# Patient Record
Sex: Male | Born: 1950 | Race: White | Hispanic: No | Marital: Single | Smoking: Never smoker
Health system: Southern US, Community
[De-identification: ages and names within clinical notes are randomized; demographics above are authoritative.]

## PROBLEM LIST (undated history)

## (undated) DIAGNOSIS — I10 Essential (primary) hypertension: Secondary | ICD-10-CM

## (undated) DIAGNOSIS — G4730 Sleep apnea, unspecified: Secondary | ICD-10-CM

## (undated) DIAGNOSIS — Z860100 Personal history of colon polyps, unspecified: Secondary | ICD-10-CM

## (undated) HISTORY — PX: HERNIA REPAIR: SHX51

## (undated) HISTORY — PX: LASIK: SHX215

## (undated) HISTORY — DX: Sleep apnea, unspecified: G47.30

## (undated) HISTORY — DX: Personal history of colonic polyps: Z86.010

## (undated) HISTORY — DX: Personal history of colon polyps, unspecified: Z86.0100

## (undated) HISTORY — DX: Essential (primary) hypertension: I10

---

## 2004-06-06 ENCOUNTER — Encounter: Admission: RE | Admit: 2004-06-06 | Discharge: 2004-06-06 | Payer: Self-pay | Admitting: Family Medicine

## 2004-12-13 ENCOUNTER — Encounter: Admission: RE | Admit: 2004-12-13 | Discharge: 2004-12-13 | Payer: Self-pay | Admitting: Family Medicine

## 2006-02-06 ENCOUNTER — Ambulatory Visit: Payer: Self-pay | Admitting: Family Medicine

## 2006-02-19 ENCOUNTER — Ambulatory Visit: Payer: Self-pay | Admitting: Family Medicine

## 2006-02-26 ENCOUNTER — Ambulatory Visit: Payer: Self-pay | Admitting: Family Medicine

## 2006-03-27 ENCOUNTER — Ambulatory Visit: Payer: Self-pay | Admitting: Family Medicine

## 2006-07-26 ENCOUNTER — Ambulatory Visit: Payer: Self-pay | Admitting: Family Medicine

## 2006-07-30 ENCOUNTER — Ambulatory Visit: Payer: Self-pay | Admitting: Family Medicine

## 2006-08-08 ENCOUNTER — Ambulatory Visit: Payer: Self-pay | Admitting: Family Medicine

## 2006-08-14 HISTORY — PX: COLONOSCOPY: SHX174

## 2006-08-14 LAB — HM COLONOSCOPY: HM Colonoscopy: NORMAL

## 2006-11-26 ENCOUNTER — Ambulatory Visit: Payer: Self-pay | Admitting: Family Medicine

## 2007-01-01 ENCOUNTER — Ambulatory Visit: Payer: Self-pay | Admitting: Family Medicine

## 2007-02-05 ENCOUNTER — Ambulatory Visit: Payer: Self-pay | Admitting: Family Medicine

## 2007-03-12 ENCOUNTER — Ambulatory Visit: Payer: Self-pay | Admitting: Family Medicine

## 2007-04-29 ENCOUNTER — Ambulatory Visit: Payer: Self-pay | Admitting: Family Medicine

## 2007-06-13 ENCOUNTER — Ambulatory Visit (HOSPITAL_COMMUNITY): Admission: RE | Admit: 2007-06-13 | Discharge: 2007-06-13 | Payer: Self-pay | Admitting: General Surgery

## 2007-11-28 ENCOUNTER — Ambulatory Visit: Payer: Self-pay | Admitting: Family Medicine

## 2008-03-24 ENCOUNTER — Ambulatory Visit: Payer: Self-pay | Admitting: Family Medicine

## 2008-03-24 ENCOUNTER — Observation Stay (HOSPITAL_COMMUNITY): Admission: AD | Admit: 2008-03-24 | Discharge: 2008-03-25 | Payer: Self-pay | Admitting: Otolaryngology

## 2008-06-13 IMAGING — CR DG CHEST 2V
2 series · 2 of 2 positions shown · non-contrast
Comparison: none

CLINICAL DATA: Preop from umbilical hernia surgery.  
 CHEST - 2 VIEW:

[w chest pa]
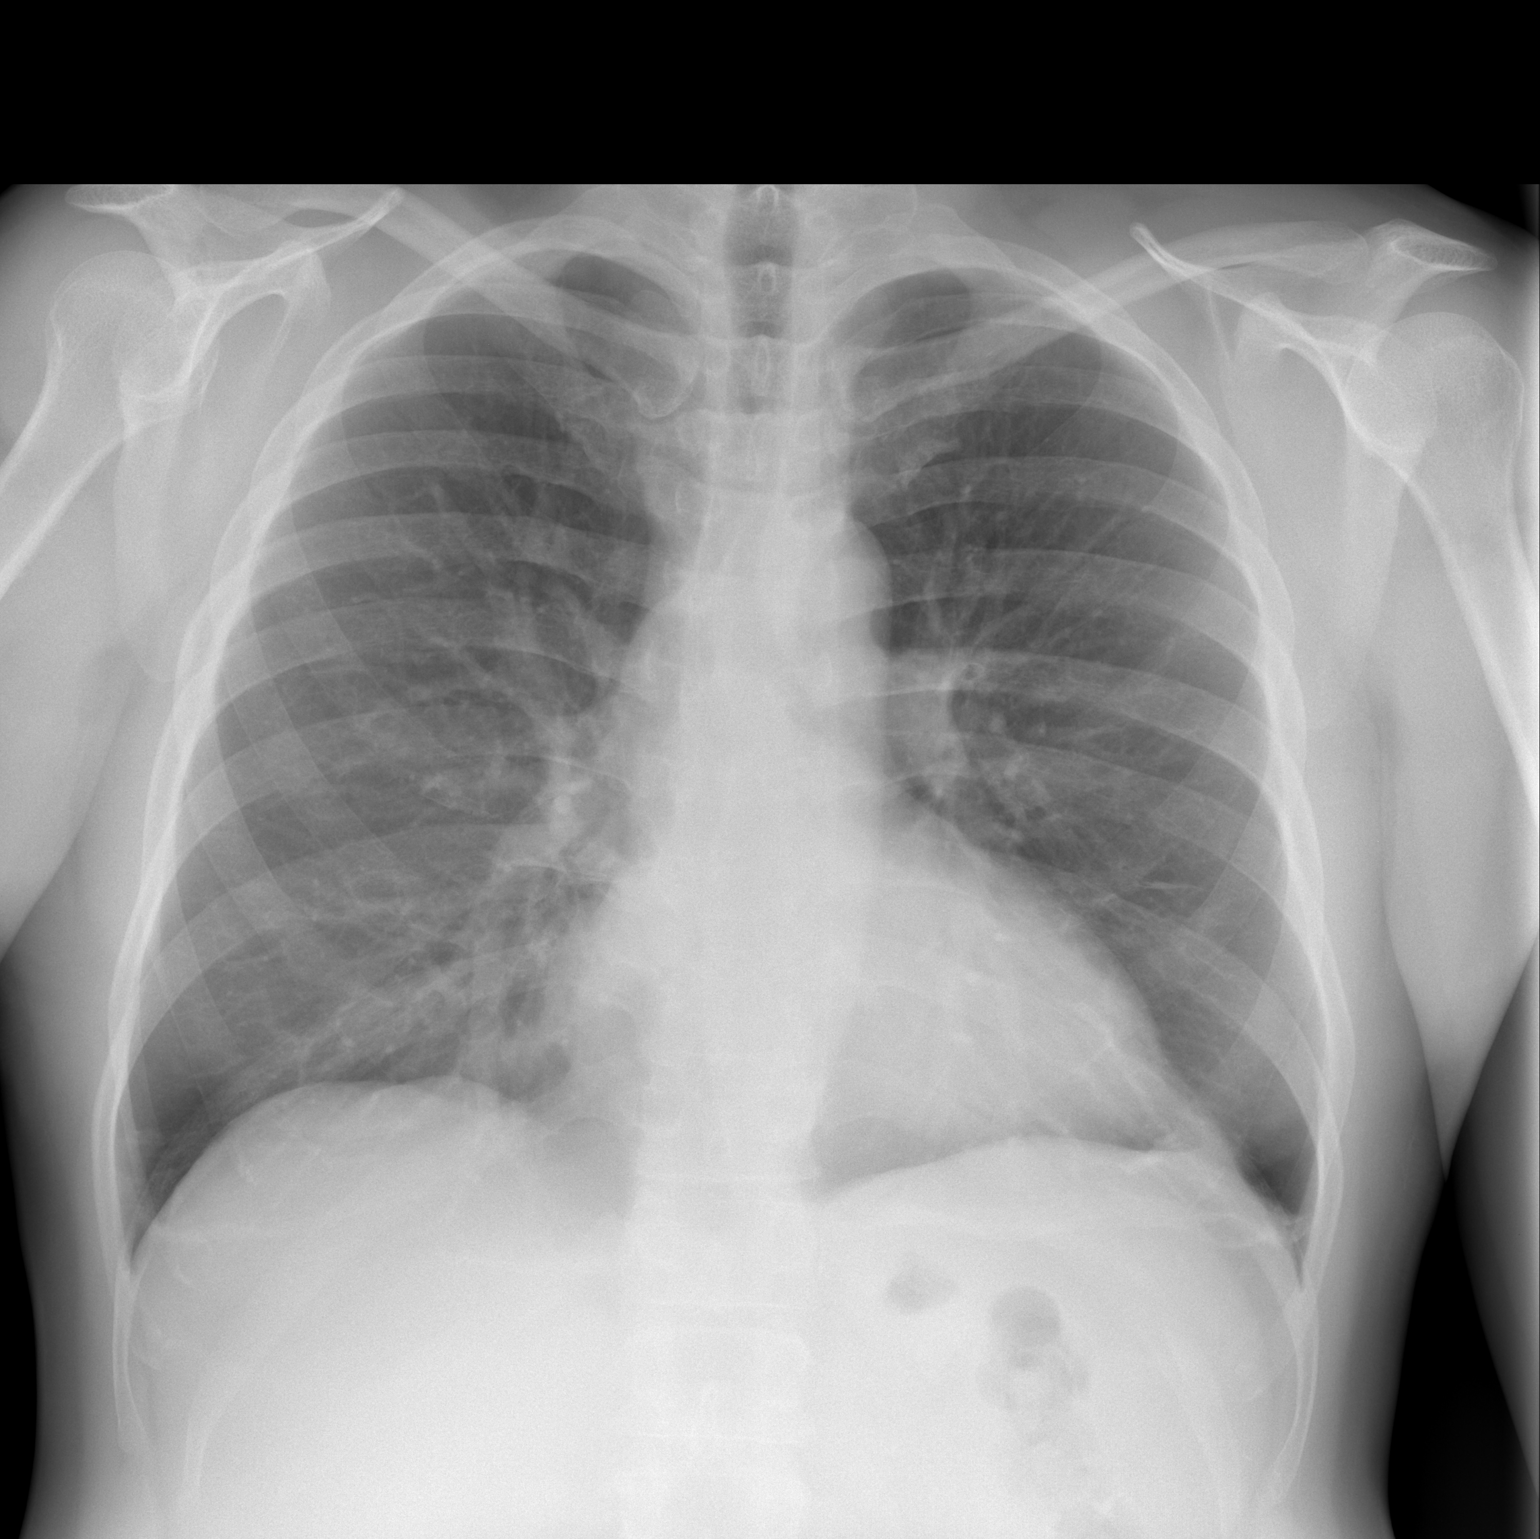

[w chest lat]
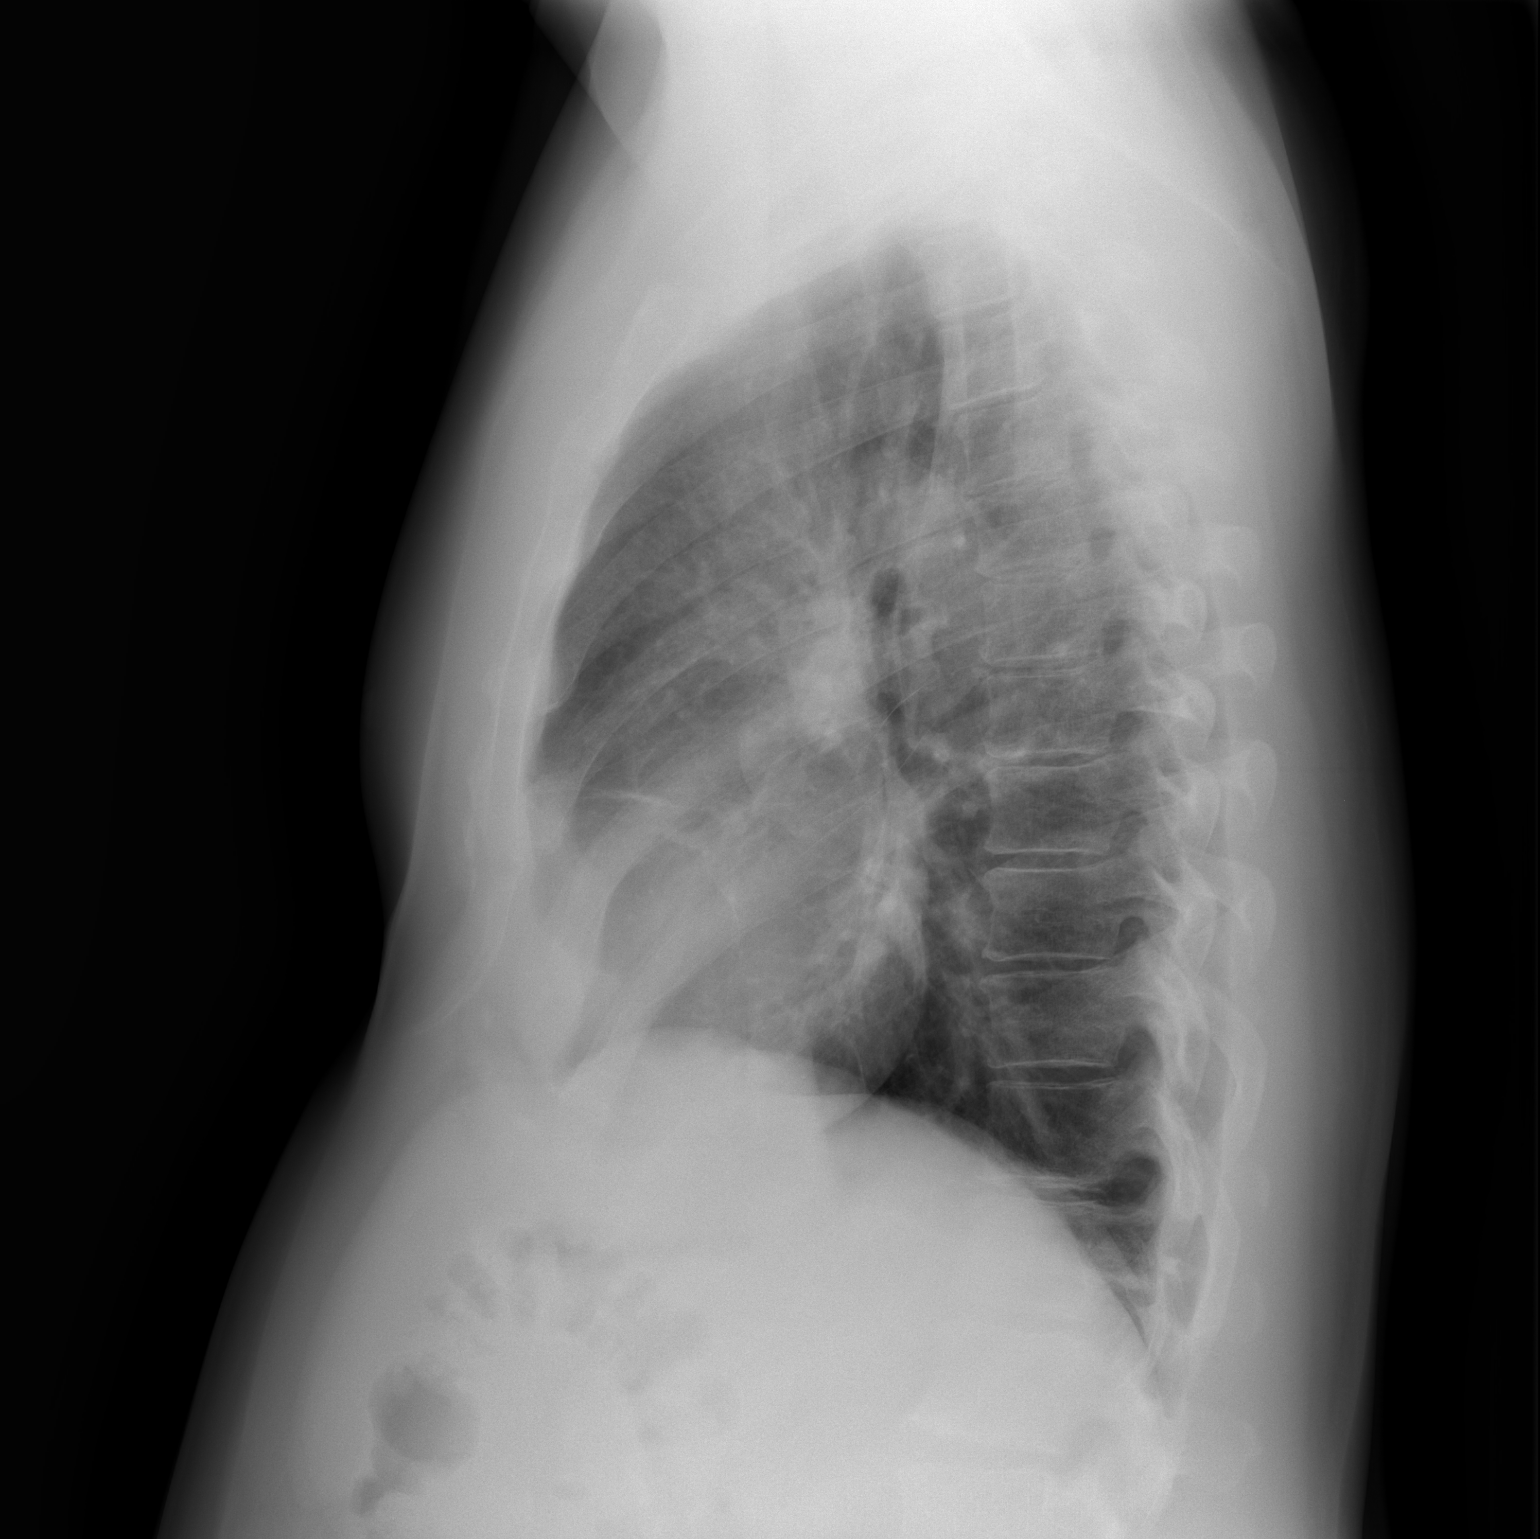

[2 of 2 positions shown; findings below may reference images not displayed]

FINDINGS: Two views of the chest show no active infiltrate or effusion.  The heart is within normal limits in size.  No bony abnormality is seen.  Basilar scarring or atelectasis is noted.
IMPRESSION: Bibasilar scarring or atelectasis.  No active lung disease.

## 2010-06-14 ENCOUNTER — Ambulatory Visit: Payer: Self-pay | Admitting: Family Medicine

## 2010-07-11 ENCOUNTER — Ambulatory Visit: Payer: Self-pay | Admitting: Family Medicine

## 2010-07-22 ENCOUNTER — Ambulatory Visit: Payer: Self-pay | Admitting: Family Medicine

## 2010-10-10 ENCOUNTER — Ambulatory Visit (INDEPENDENT_AMBULATORY_CARE_PROVIDER_SITE_OTHER): Payer: 59 | Admitting: Family Medicine

## 2010-10-10 DIAGNOSIS — L02419 Cutaneous abscess of limb, unspecified: Secondary | ICD-10-CM

## 2010-10-12 ENCOUNTER — Ambulatory Visit (INDEPENDENT_AMBULATORY_CARE_PROVIDER_SITE_OTHER): Payer: 59 | Admitting: Family Medicine

## 2010-10-12 DIAGNOSIS — L0390 Cellulitis, unspecified: Secondary | ICD-10-CM

## 2010-10-12 DIAGNOSIS — I10 Essential (primary) hypertension: Secondary | ICD-10-CM

## 2010-10-12 DIAGNOSIS — Z79899 Other long term (current) drug therapy: Secondary | ICD-10-CM

## 2010-10-13 ENCOUNTER — Ambulatory Visit (INDEPENDENT_AMBULATORY_CARE_PROVIDER_SITE_OTHER): Payer: 59 | Admitting: Family Medicine

## 2010-10-13 DIAGNOSIS — L03119 Cellulitis of unspecified part of limb: Secondary | ICD-10-CM

## 2010-10-14 ENCOUNTER — Ambulatory Visit (INDEPENDENT_AMBULATORY_CARE_PROVIDER_SITE_OTHER): Payer: 59 | Admitting: Family Medicine

## 2010-10-14 DIAGNOSIS — L03119 Cellulitis of unspecified part of limb: Secondary | ICD-10-CM

## 2010-10-17 ENCOUNTER — Ambulatory Visit (INDEPENDENT_AMBULATORY_CARE_PROVIDER_SITE_OTHER): Payer: 59 | Admitting: Family Medicine

## 2010-10-17 DIAGNOSIS — L0390 Cellulitis, unspecified: Secondary | ICD-10-CM

## 2010-10-21 ENCOUNTER — Ambulatory Visit (INDEPENDENT_AMBULATORY_CARE_PROVIDER_SITE_OTHER): Payer: 59 | Admitting: Family Medicine

## 2010-10-21 DIAGNOSIS — L02419 Cutaneous abscess of limb, unspecified: Secondary | ICD-10-CM

## 2010-12-27 NOTE — Op Note (Signed)
NAME:  Jeffrey Forbes, Jeffrey Forbes NO.:  [id number]   MEDICAL RECORD NO.:  [id number]          PATIENT TYPE:  AMB   LOCATION:  DAY                          FACILITY:  [facility]   PHYSICIAN:  Timothy E. Earlene Plater, M.D. DATE OF BIRTH:  12-Jul-1951   DATE OF PROCEDURE:  06/13/2007  DATE OF DISCHARGE:                               OPERATIVE REPORT   PREOPERATIVE DIAGNOSIS:  Umbilical hernia.Marland Kitchen   POSTOPERATIVE DIAGNOSIS:  Umbilical hernia.Marland Kitchen   PROCEDURE:  Repair of hernia with mesh.   SURGEON:  Timothy E. Earlene Plater, M.D.   ANESTHESIA:  Local standby.   Jeffrey Forbes is 49.  He works as a Curator.  He is somewhat overweight  and has developed symptomatic umbilical hernia, wishes to have it  repaired.  This has been carefully discussed.  He is seen, identified  and the permit signed.   He is taken to the operating room, placed supine.  IV sedation given.  The peri umbilical area clipped, prepped and draped.  0.25% Marcaine  with epinephrine was used throughout for local anesthesia.  The hernia  was present on the right side of the umbilical depression. I  anesthetized the entire area, made a curvilinear incision above the  umbilical curve, carried that down to the fascia.  The hernia defect was  only 1 cm but the herniated contents were much larger.  In spite of his  breathing, the contents could be reduced, the defect dissected both  above and below the fascia and a modified Ventralex patch was placed  into the defect and sutured to the anterior fascia.  This was carried  out without difficulty or complications.  The mesh was sewn down with  four sutures of Prolene and four sutures of Vicryl.  The umbilical skin  was positioned and then the wound was closed with Monocryl suture and  Steri-Strips.  Final counts correct.  He tolerated it well, was removed  recovery room in good condition.   Written and verbal instructions including Vicodin #36 were given.  He  will be followed as an  outpatient.      Timothy E. Earlene Plater, M.D.  Electronically Signed     TED/MEDQ  D:  06/13/2007  T:  06/13/2007  Job:  [id number]   cc:   Sharlot Gowda, M.D.  Fax: [phone]

## 2011-03-25 ENCOUNTER — Inpatient Hospital Stay (INDEPENDENT_AMBULATORY_CARE_PROVIDER_SITE_OTHER)
Admission: RE | Admit: 2011-03-25 | Discharge: 2011-03-25 | Disposition: A | Discharge status: Home / Self Care | Payer: 59 | Source: Ambulatory Visit | Attending: Emergency Medicine | Admitting: Emergency Medicine

## 2011-03-25 DIAGNOSIS — T148XXA Other injury of unspecified body region, initial encounter: Secondary | ICD-10-CM

## 2011-05-12 LAB — BASIC METABOLIC PANEL
BUN: 8
CO2: 30
Calcium: 8.6
Chloride: 105
Creatinine, Ser: 0.88
GFR calc Af Amer: >60
GFR calc non Af Amer: >60
Glucose, Bld: 121 — ABNORMAL HIGH
Potassium: 3.1 — ABNORMAL LOW
Sodium: 142

## 2011-05-12 LAB — CBC
HCT: 45.9
Hemoglobin: 15.6
MCHC: 34
MCV: 93.6
Platelets: 157
RBC: 4.9
RDW: 12.6
WBC: 6.1

## 2011-05-12 LAB — VANCOMYCIN, RANDOM: Vancomycin Rm: 10

## 2011-05-24 LAB — COMPREHENSIVE METABOLIC PANEL
ALT: 38
AST: 31
Albumin: 4.1
Alkaline Phosphatase: 58
BUN: 14
CO2: 32
Calcium: 8.8
Chloride: 104
Creatinine, Ser: 1.08
GFR calc Af Amer: >60
GFR calc non Af Amer: >60
Glucose, Bld: 98
Potassium: 3.4 — ABNORMAL LOW
Sodium: 142
Total Bilirubin: 1.1
Total Protein: 6.3

## 2011-05-24 LAB — CBC
HCT: 40.4
Hemoglobin: 14.2
MCHC: 35.2
MCV: 92.6
Platelets: 175
RBC: 4.36
RDW: 13.4
WBC: 5.3

## 2011-05-24 LAB — DIFFERENTIAL
Basophils Absolute: 0
Basophils Relative: 0
Eosinophils Absolute: 0.2
Eosinophils Relative: 4
Lymphocytes Relative: 34
Lymphs Abs: 1.8
Monocytes Absolute: 0.5
Monocytes Relative: 9
Neutro Abs: 2.8
Neutrophils Relative %: 53

## 2011-08-14 ENCOUNTER — Other Ambulatory Visit: Payer: Self-pay | Admitting: Family Medicine

## 2011-08-14 NOTE — Telephone Encounter (Signed)
Pt must have appt for further refills.  °

## 2011-09-12 ENCOUNTER — Encounter: Payer: Self-pay | Admitting: Family Medicine

## 2011-09-12 ENCOUNTER — Ambulatory Visit (INDEPENDENT_AMBULATORY_CARE_PROVIDER_SITE_OTHER): Payer: 59 | Admitting: Family Medicine

## 2011-09-12 VITALS — BP 152/82 | HR 64 | Ht 71.0 in | Wt 194.0 lb

## 2011-09-12 DIAGNOSIS — I10 Essential (primary) hypertension: Secondary | ICD-10-CM | POA: Insufficient documentation

## 2011-09-12 DIAGNOSIS — J019 Acute sinusitis, unspecified: Secondary | ICD-10-CM

## 2011-09-12 MED ORDER — LOSARTAN POTASSIUM-HCTZ 100-12.5 MG PO TABS: 1.0000 | ORAL_TABLET | Freq: Every day | ORAL | Status: DC

## 2011-09-12 MED ORDER — AMOXICILLIN-POT CLAVULANATE 875-125 MG PO TABS: 1.0000 | ORAL_TABLET | Freq: Two times a day (BID) | ORAL | Status: AC

## 2011-09-12 NOTE — Progress Notes (Signed)
  Subjective:    Patient ID: Jeffrey Forbes, male    DOB: 03-Jun-1951, 61 y.o.   MRN: [id number]  HPI He is here for medication recheck. He continues on his blood pressure medication. He is interested in starting an exercise program again. He did have a recent history of a sinusitis and was given Augmentin. He still is having some nasal congestion and occasional blood-tinged mucus. No fever, chills, cough or congestion. He does have a previous history of sinus disease   Review of Systems     Objective:   Physical Exam alert and in no distress. Tympanic membranes and canals are normal. Throat is clear. Tonsils are normal. Neck is supple without adenopathy or thyromegaly. Cardiac exam shows a regular sinus rhythm without murmurs or gallops. Lungs are clear to auscultation.        Assessment & Plan:   1. Sinusitis acute   2. Hypertension    I will place him back on Augmentin. Renew his blood pressure medication and recheck blood pressure in 3 months. Discussed the possibility of placing him on another antihypertensive but will hold off at this time.

## 2011-09-25 ENCOUNTER — Encounter: Payer: Self-pay | Admitting: Family Medicine

## 2011-09-25 ENCOUNTER — Ambulatory Visit (INDEPENDENT_AMBULATORY_CARE_PROVIDER_SITE_OTHER): Payer: 59 | Admitting: Family Medicine

## 2011-09-25 VITALS — BP 124/86 | HR 68 | Wt 197.0 lb

## 2011-09-25 DIAGNOSIS — L0501 Pilonidal cyst with abscess: Secondary | ICD-10-CM

## 2011-09-25 DIAGNOSIS — J309 Allergic rhinitis, unspecified: Secondary | ICD-10-CM | POA: Insufficient documentation

## 2011-09-25 MED ORDER — FLUTICASONE PROPIONATE 50 MCG/ACT NA SUSP: 2.0000 | Freq: Every day | NASAL | Status: DC

## 2011-09-25 NOTE — Progress Notes (Signed)
  Subjective:    Patient ID: Jeffrey Forbes, male    DOB: 1950/11/22, 61 y.o.   MRN: [id number]  HPI Is here for consult concerning 2 issues. He does have underlying allergies and continues to have some nasal congestion and drainage. In the past he had been given Flonase and would like a refill on this. He does have a history of difficulty with sinus infections.  He is also here to discuss intermittent difficulty with pain swelling and discharge from a lesion in the pilonidal area. This has been bothering him off and on for several months. It will swell, cause some discomfort and then drain on its own.   Review of Systems     Objective:   Physical Exam Alert and in no distress. Exam of the gluteal crease does show evidence of recent infection with some erythema but no induration.       Assessment & Plan:   1. Allergic rhinitis, mild   2. Pilonidal abscess    I will give him a Flonase. Also discussed proper care of the pilonidal abscess. He is to return here when the lesion reforms for more definitive care.

## 2011-09-25 NOTE — Patient Instructions (Signed)
The next time this occurs set up an appointment so we can fix this

## 2011-12-05 ENCOUNTER — Encounter: Payer: Self-pay | Admitting: Internal Medicine

## 2011-12-14 ENCOUNTER — Ambulatory Visit (INDEPENDENT_AMBULATORY_CARE_PROVIDER_SITE_OTHER): Payer: 59 | Admitting: Family Medicine

## 2011-12-14 ENCOUNTER — Encounter: Payer: Self-pay | Admitting: Family Medicine

## 2011-12-14 VITALS — BP 138/90 | HR 80 | Wt 191.0 lb

## 2011-12-14 DIAGNOSIS — I10 Essential (primary) hypertension: Secondary | ICD-10-CM

## 2011-12-14 DIAGNOSIS — J309 Allergic rhinitis, unspecified: Secondary | ICD-10-CM

## 2011-12-14 DIAGNOSIS — R040 Epistaxis: Secondary | ICD-10-CM

## 2011-12-14 MED ORDER — MOMETASONE FUROATE 50 MCG/ACT NA SUSP: 2.0000 | Freq: Every day | NASAL | Status: DC

## 2011-12-14 NOTE — Progress Notes (Signed)
  Subjective:    Patient ID: Jeffrey Forbes, male    DOB: 1951-02-13, 61 y.o.   MRN: [id number]  HPI He has had difficulty recently with intermittent nosebleeds but in the last week, the nose bleeds have been daily but a very small amount, usually in the morning. He continues on Flonase but cannot relate this specifically to the cause. He continues on blood pressure medication and is concerned over the reading.   Review of Systems     Objective:   Physical Exam Alert and in no distress. Blood pressure is recorded. Nasal mucosa is slightly red but no evidence of recent bleeding noted. Neck is supple without adenopathy.       Assessment & Plan:  Hypertension. Allergic rhinitis. Epistaxis Continue on present blood pressure medication. I will switch him to Nasonex. He is to let me know if this helps with his epistaxis.

## 2012-03-23 ENCOUNTER — Ambulatory Visit: Payer: 59

## 2012-03-23 ENCOUNTER — Ambulatory Visit (INDEPENDENT_AMBULATORY_CARE_PROVIDER_SITE_OTHER): Payer: 59 | Admitting: Emergency Medicine

## 2012-03-23 VITALS — BP 155/92 | HR 76 | Temp 98.3°F | Resp 16 | Ht 71.0 in | Wt 198.8 lb

## 2012-03-23 DIAGNOSIS — S0180XA Unspecified open wound of other part of head, initial encounter: Secondary | ICD-10-CM

## 2012-03-23 DIAGNOSIS — M25519 Pain in unspecified shoulder: Secondary | ICD-10-CM

## 2012-03-23 DIAGNOSIS — S5000XA Contusion of unspecified elbow, initial encounter: Secondary | ICD-10-CM

## 2012-03-23 DIAGNOSIS — M79603 Pain in arm, unspecified: Secondary | ICD-10-CM

## 2012-03-23 DIAGNOSIS — S0181XA Laceration without foreign body of other part of head, initial encounter: Secondary | ICD-10-CM

## 2012-03-23 MED ORDER — HYDROCODONE-ACETAMINOPHEN 5-500 MG PO TABS: 1.0000 | ORAL_TABLET | ORAL | Status: AC | PRN

## 2012-03-23 NOTE — Progress Notes (Signed)
Patient ID: AQIB LOUGH MRN: [id number], DOB: 1950-11-22, 61 y.o. Date of Encounter: 03/23/2012, 4:41 PM   PROCEDURE NOTE: Verbal consent obtained. Sterile technique employed. Numbing: Anesthesia obtained with 2% lidocaine with epi.   Cleansed with soap and water. Irrigated.  Wound explored, no deep structures involved, no foreign bodies.   Wound repaired with # 5 SI using 5-0 prolene.  Hemostasis obtained. Wound cleansed and dressed.  Wound care instructions including precautions covered with patient. Handout given.  Anticipate suture removal in 5-7 days.   Rhoderick Moody, PA-C 03/23/2012 4:41 PM

## 2012-03-23 NOTE — Progress Notes (Signed)
   Date:  03/23/2012   Name:  Jeffrey Forbes   DOB:  Sep 14, 1950   MRN:  [id number] Gender: male  Age: 61 y.o.  PCP:  Carollee Herter, MD    Chief Complaint: Facial Laceration and LEFT ARM PAIN   History of Present Illness:  Jeffrey Forbes is a 61 y.o. pleasant patient who presents with the following:  Got tangled up letting his dog out on the back porch landing and fell landing on his left elbow injuring his elbow and shoulder.  Struck his chin on the concrete and has a laceration of his chin that he did not notice until he arrived at work.  Denies any LOC, neuro or visual symptoms.  No dental complaints; loose teeth, TMJ pain or fractured teeth.    Patient Active Problem List  Diagnosis  . Hypertension  . Allergic rhinitis, mild    Past Medical History  Diagnosis Date  . Allergic rhinitis   . Sleep apnea   . Hypertension   . Hemorrhoids   . Personal history of colonic polyps     Past Surgical History  Procedure Date  . Colonoscopy 2008    Dr. Elnoria Howard    History  Substance Use Topics  . Smoking status: Never Smoker   . Smokeless tobacco: Never Used  . Alcohol Use: Not on file    No family history on file.  No Known Allergies  Medication list has been reviewed and updated.  Current Outpatient Prescriptions on File Prior to Visit  Medication Sig Dispense Refill  . aspirin 81 MG tablet Take 160 mg by mouth daily.      . fish oil-omega-3 fatty acids 1000 MG capsule Take 2 g by mouth daily.      Marland Kitchen losartan-hydrochlorothiazide (HYZAAR) 100-12.5 MG per tablet Take 1 tablet by mouth daily.  30 tablet  11  . mometasone (NASONEX) 50 MCG/ACT nasal spray Place 2 sprays into the nose daily.  17 g  12  . Multiple Vitamins-Minerals (MULTIVITAMIN WITH MINERALS) tablet Take 1 tablet by mouth daily.        Review of Systems:  As per HPI, otherwise negative.    Physical Examination: Filed Vitals:   03/23/12 1548  BP: 155/92  Pulse: 76  Temp: 98.3 F (36.8 C)    Resp: 16   Filed Vitals:   03/23/12 1548  Height: 5\' 11"  (1.803 m)  Weight: 198 lb 12.8 oz (90.175 kg)   Body mass index is 27.73 kg/(m^2). Ideal Body Weight: Weight in (lb) to have BMI = 25: 178.9    GEN: WDWN, NAD, Non-toxic, Alert & Oriented x 3 HEENT: Atraumatic, Normocephalic.  Ears and Nose: No external deformity. EXTR: No clubbing/cyanosis/edema.  Tender and guards shoulder but full passive ROM.  No ecchymosis or crepitus.  Elbow abrasion with full painless rom.  NATI. NEURO: Normal gait.   Gross motor and cerebellar intact PSYCH: Normally interactive. Conversant. Not depressed or anxious appearing.  Calm demeanor.  Chin:  1.5 cm transverse laceration with no fb.  TMJ, dentition and occlusion are intact.   Assessment and Plan: Laceration chin Shoulder contusion Elbow contusion  Arm sling Follow up in one week Off work til Tuesday vicodin   Carmelina Dane, MD  [facility] reading (PRIMARY) by  Dr. Dareen Piano.  No osseous injury.

## 2012-03-23 NOTE — Patient Instructions (Addendum)
Shoulder Pain The shoulder is a ball and socket joint. The muscles and tendons (rotator cuff) are what keep the shoulder in its joint and stable. This collection of muscles and tendons holds in the head (ball) of the humerus (upper arm bone) in the fossa (cup) of the scapula (shoulder blade). Today no reason was found for your shoulder pain. Often pain in the shoulder may be treated conservatively with temporary immobilization. For example, holding the shoulder in one place using a sling for rest. Physical therapy may be needed if problems continue. HOME CARE INSTRUCTIONS   Apply ice to the sore area for 15 to 20 minutes, 3 to 4 times per day for the first 2 days. Put the ice in a plastic bag. Place a towel between the bag of ice and your skin.   If you have or were given a shoulder sling and straps, do not remove for as long as directed by your caregiver or until you see a caregiver for a follow-up examination. If you need to remove it to shower or bathe, move your arm as little as possible.   Sleep on several pillows at night to lessen swelling and pain.   Only take over-the-counter or prescription medicines for pain, discomfort, or fever as directed by your caregiver.   Keep any follow-up appointments in order to avoid any type of permanent shoulder disability or chronic pain problems.  SEEK MEDICAL CARE IF:   Pain in your shoulder increases or new pain develops in your arm, hand, or fingers.   Your hand or fingers are colder than your other hand.   You do not obtain pain relief with the medications or your pain becomes worse.  SEEK IMMEDIATE MEDICAL CARE IF:   Your arm, hand, or fingers are numb or tingling.   Your arm, hand, or fingers are swollen, painful, or turn white or blue.   You develop chest pain or shortness of breath.  MAKE SURE YOU:   Understand these instructions.   Will watch your condition.   Will get help right away if you are not doing well or get worse.    Document Released: 05/10/2005 Document Revised: 07/20/2011 Document Reviewed: 07/15/2011 [facility] Patient Information 2012 [place], [place].Laceration Care, Adult A laceration is a cut or lesion that goes through all layers of the skin and into the tissue just beneath the skin. TREATMENT  Some lacerations may not require closure. Some lacerations may not be able to be closed due to an increased risk of infection. It is important to see your caregiver as soon as possible after an injury to minimize the risk of infection and maximize the opportunity for successful closure. If closure is appropriate, pain medicines may be given, if needed. The wound will be cleaned to help prevent infection. Your caregiver will use stitches (sutures), staples, wound glue (adhesive), or skin adhesive strips to repair the laceration. These tools bring the skin edges together to allow for faster healing and a better cosmetic outcome. However, all wounds will heal with a scar. Once the wound has healed, scarring can be minimized by covering the wound with sunscreen during the day for 1 full year. HOME CARE INSTRUCTIONS  For sutures or staples:  Keep the wound clean and dry.   If you were given a bandage (dressing), you should change it at least once a day. Also, change the dressing if it becomes wet or dirty, or as directed by your caregiver.   Wash the wound with soap and  water 2 times a day. Rinse the wound off with water to remove all soap. Pat the wound dry with a clean towel.   After cleaning, apply a thin layer of the antibiotic ointment as recommended by your caregiver. This will help prevent infection and keep the dressing from sticking.   You may shower as usual after the first 24 hours. Do not soak the wound in water until the sutures are removed.   Only take over-the-counter or prescription medicines for pain, discomfort, or fever as directed by your caregiver.   Get your sutures or staples removed as  directed by your caregiver.  For skin adhesive strips:  Keep the wound clean and dry.   Do not get the skin adhesive strips wet. You may bathe carefully, using caution to keep the wound dry.   If the wound gets wet, pat it dry with a clean towel.   Skin adhesive strips will fall off on their own. You may trim the strips as the wound heals. Do not remove skin adhesive strips that are still stuck to the wound. They will fall off in time.  For wound adhesive:  You may briefly wet your wound in the shower or bath. Do not soak or scrub the wound. Do not swim. Avoid periods of heavy perspiration until the skin adhesive has fallen off on its own. After showering or bathing, gently pat the wound dry with a clean towel.   Do not apply liquid medicine, cream medicine, or ointment medicine to your wound while the skin adhesive is in place. This may loosen the film before your wound is healed.   If a dressing is placed over the wound, be careful not to apply tape directly over the skin adhesive. This may cause the adhesive to be pulled off before the wound is healed.   Avoid prolonged exposure to sunlight or tanning lamps while the skin adhesive is in place. Exposure to ultraviolet light in the first year will darken the scar.   The skin adhesive will usually remain in place for 5 to 10 days, then naturally fall off the skin. Do not pick at the adhesive film.  You may need a tetanus shot if:  You cannot remember when you had your last tetanus shot.   You have never had a tetanus shot.  If you get a tetanus shot, your arm may swell, get red, and feel warm to the touch. This is common and not a problem. If you need a tetanus shot and you choose not to have one, there is a rare chance of getting tetanus. Sickness from tetanus can be serious. SEEK MEDICAL CARE IF:   You have redness, swelling, or increasing pain in the wound.   You see a red line that goes away from the wound.   You have  yellowish-white fluid (pus) coming from the wound.   You have a fever.   You notice a bad smell coming from the wound or dressing.   Your wound breaks open before or after sutures have been removed.   You notice something coming out of the wound such as wood or glass.   Your wound is on your hand or foot and you cannot move a finger or toe.  SEEK IMMEDIATE MEDICAL CARE IF:   Your pain is not controlled with prescribed medicine.   You have severe swelling around the wound causing pain and numbness or a change in color in your arm, hand, leg, or foot.  Your wound splits open and starts bleeding.   You have worsening numbness, weakness, or loss of function of any joint around or beyond the wound.   You develop painful lumps near the wound or on the skin anywhere on your body.  MAKE SURE YOU:   Understand these instructions.   Will watch your condition.   Will get help right away if you are not doing well or get worse.  Document Released: 07/31/2005 Document Revised: 07/20/2011 Document Reviewed: 01/24/2011 [facility] Patient Information 2012 [place], [place].

## 2012-03-27 ENCOUNTER — Ambulatory Visit (INDEPENDENT_AMBULATORY_CARE_PROVIDER_SITE_OTHER): Payer: 59 | Admitting: Family Medicine

## 2012-03-27 VITALS — BP 169/85 | HR 69 | Temp 98.0°F | Resp 18 | Ht 69.0 in | Wt 198.0 lb

## 2012-03-27 DIAGNOSIS — M25529 Pain in unspecified elbow: Secondary | ICD-10-CM

## 2012-03-27 DIAGNOSIS — M25519 Pain in unspecified shoulder: Secondary | ICD-10-CM

## 2012-03-27 NOTE — Progress Notes (Signed)
[facility]:  Office Visit  Chief Complaint:  Chief Complaint  Patient presents with  . Shoulder Pain    left shoulder follow up    HPI: Jeffrey Forbes is a 61 y.o. male who complains of  Shoulder and elbow pain s/p fall while walking dog.  ROM inproved, pain improved. 70% better.   Past Medical History  Diagnosis Date  . Allergic rhinitis   . Sleep apnea   . Hypertension   . Hemorrhoids   . Personal history of colonic polyps    Past Surgical History  Procedure Date  . Colonoscopy 2008    Dr. Elnoria Howard   History   Social History  . Marital Status: Single    Spouse Name: N/A    Number of Children: N/A  . Years of Education: N/A   Social History Main Topics  . Smoking status: Never Smoker   . Smokeless tobacco: Never Used  . Alcohol Use: Yes     occass  . Drug Use: None  . Sexually Active: None   Other Topics Concern  . None   Social History Narrative  . None   No family history on file. No Known Allergies Prior to Admission medications   Medication Sig Start Date End Date Taking? Authorizing Provider  aspirin 81 MG tablet Take 160 mg by mouth daily.   Yes Historical Provider, MD  fish oil-omega-3 fatty acids 1000 MG capsule Take 2 g by mouth daily.   Yes Historical Provider, MD  losartan-hydrochlorothiazide (HYZAAR) 100-12.5 MG per tablet Take 1 tablet by mouth daily. 09/12/11  Yes Ronnald Nian, MD  mometasone (NASONEX) 50 MCG/ACT nasal spray Place 2 sprays into the nose daily. 12/14/11 12/13/12 Yes Ronnald Nian, MD  Multiple Vitamins-Minerals (MULTIVITAMIN WITH MINERALS) tablet Take 1 tablet by mouth daily.   Yes Historical Provider, MD  HYDROcodone-acetaminophen (VICODIN) 5-500 MG per tablet Take 1 tablet by mouth every 4 (four) hours as needed for pain. 03/23/12 04/02/12  Phillips Odor, MD     ROS: The patient denies fevers, chills, night sweats, unintentional weight loss, chest pain, palpitations, wheezing, dyspnea on exertion, nausea,  vomiting, abdominal pain, dysuria, hematuria, melena, numbness, weakness, or tingling.  All other systems have been reviewed and were otherwise negative with the exception of those mentioned in the HPI and as above.    PHYSICAL EXAM: Filed Vitals:   03/27/12 1815  BP: 169/85  Pulse: 69  Temp: 98 F (36.7 C)  Resp: 18   Filed Vitals:   03/27/12 1815  Height: 5\' 9"  (1.753 m)  Weight: 198 lb (89.812 kg)   Body mass index is 29.24 kg/(m^2).  General: Alert, no acute distress HEENT:  Normocephalic, atraumatic, oropharynx patent.  Cardiovascular:  Regular rate and rhythm, no rubs murmurs or gallops.  No Carotid bruits, radial pulse intact. No pedal edema.  Respiratory: Clear to auscultation bilaterally.  No wheezes, rales, or rhonchi.  No cyanosis, no use of accessory musculature GI: No organomegaly, abdomen is soft and non-tender, positive bowel sounds.  No masses. Skin: No rashes. Neurologic: Facial musculature symmetric. Psychiatric: Patient is appropriate throughout our interaction. Lymphatic: No cervical lymphadenopathy Musculoskeletal: Gait intact. AROM improved per patient, still has slight decrease ROM with AROm and PROM secodnary to pain in IR and ER 5/5 strength Sensation intation Radial pulse intact    LABS: Results for orders placed in visit on 12/14/11  [facility] COLONOSCOPY      Component Value Range   [facility] Colonoscopy  Dr.Hung normal       EKG/XRAY:   Primary read interpreted by Dr. Conley Rolls at [facility].   ASSESSMENT/PLAN: Encounter Diagnoses  Name Primary?  . Shoulder pain Yes  . Elbow pain    Continue with current medications, ROM exercises and use of sling prn F/u if have worsening sxs    Jibril Mcminn PHUONG, DO 03/27/2012 6:29 PM

## 2012-04-01 ENCOUNTER — Ambulatory Visit (INDEPENDENT_AMBULATORY_CARE_PROVIDER_SITE_OTHER): Payer: 59 | Admitting: Internal Medicine

## 2012-04-01 VITALS — BP 152/82 | HR 67 | Temp 98.3°F | Resp 16 | Ht 69.38 in | Wt 196.0 lb

## 2012-04-01 DIAGNOSIS — IMO0002 Reserved for concepts with insufficient information to code with codable children: Secondary | ICD-10-CM

## 2012-04-01 DIAGNOSIS — S43429A Sprain of unspecified rotator cuff capsule, initial encounter: Secondary | ICD-10-CM

## 2012-04-01 DIAGNOSIS — Z4802 Encounter for removal of sutures: Secondary | ICD-10-CM

## 2012-04-01 MED ORDER — MELOXICAM 15 MG PO TABS: 15.0000 mg | ORAL_TABLET | Freq: Every day | ORAL | Status: DC

## 2012-04-01 NOTE — Progress Notes (Signed)
  Subjective:    Patient ID: Jeffrey Forbes, male    DOB: [birth year], 61 y.o.   MRN: [id number]  HPI  Fall 03/23/12 : Got tangled up letting his dog out on the back porch landing and fell landing on his left elbow injuring his elbow and shoulder. Struck his chin on the concrete and has a laceration of his chin; seen here 8/10 and 8/14; xrays normal.   1. Left shoulder pain not keeping patient awake; pain with lateral extension, no pain with arm supination or pronation, pain when extends arm backward, no pain with pressure to top of shoulder, no arm pain  2. Suture removal  Chin  Review of Systems     Objective:   Physical Exam        Assessment & Plan:  PT

## 2012-04-02 NOTE — Progress Notes (Signed)
  Subjective:    Patient ID: Jeffrey Forbes, male    DOB: Apr 10, 1951, 61 y.o.   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 meloxicam (MOBIC) 15 MG tablet    Sig: Take 1 tablet (15 mg total) by mouth daily.    Dispense:  30 tablet    Refill:  1  Sling for shoulder Followup in 2 weeks/note for significant light duty no use of left arm until then

## 2012-04-28 ENCOUNTER — Encounter: Payer: Self-pay | Admitting: Internal Medicine

## 2012-05-13 ENCOUNTER — Telehealth: Payer: Self-pay

## 2012-05-13 NOTE — Telephone Encounter (Signed)
DR.DOOLITTLE: PT WOULD LIKE FMLA PAPERS RE-FILLED OUT BECAUSE [employer] WOULD LIKE TO THE FORM TO INCLUDE MORE INFORMATION TO APPROVE HIS FMLA. FORMS ARE LOCATED IN MAUDIA'S BOX AT THIS TIME. BEST# [phone]

## 2012-05-17 ENCOUNTER — Encounter: Payer: Self-pay | Admitting: Internal Medicine

## 2012-05-20 NOTE — Telephone Encounter (Signed)
Dr Merla Riches asked me to print off OV notes from 03/23/12, 03/27/12 and 04/01/12 to include w/ pt's FMLA papers for the add'l info needed. Printed all note from these OVs and notified pt they are ready for p/up (in drawer at 102).

## 2012-06-01 ENCOUNTER — Other Ambulatory Visit: Payer: Self-pay | Admitting: Internal Medicine

## 2012-06-08 ENCOUNTER — Ambulatory Visit: Payer: 59 | Admitting: Emergency Medicine

## 2012-06-08 VITALS — BP 174/94 | HR 80 | Temp 98.1°F | Resp 16 | Ht 70.0 in | Wt 192.0 lb

## 2012-06-08 DIAGNOSIS — J018 Other acute sinusitis: Secondary | ICD-10-CM

## 2012-06-08 MED ORDER — AMOXICILLIN-POT CLAVULANATE 875-125 MG PO TABS: 1.0000 | ORAL_TABLET | Freq: Two times a day (BID) | ORAL | Status: DC

## 2012-06-08 MED ORDER — PSEUDOEPHEDRINE-GUAIFENESIN ER 60-600 MG PO TB12: 1.0000 | ORAL_TABLET | Freq: Two times a day (BID) | ORAL | Status: DC

## 2012-06-08 NOTE — Progress Notes (Signed)
[facility] [address] [place] [address] [phone]  Date:  06/08/2012   Name:  Jeffrey Forbes   DOB:  May 21, 1951   MRN:  [id number]  PCP:  Carollee Herter, MD    Chief Complaint: Shoulder Injury, Nasal Congestion and Ear Drainage   History of Present Illness:  Jeffrey Forbes is a 61 y.o. very pleasant male patient who presents with the following:  Had rotator cuff tear back in August.  Underwent 8 weeks of PT and reportedly has achieved maximal improvement.  Still has some twinges of pain at times.  Full active ROM and normal strength.  Wednesday developed pain and pressure in right maxillary sinus and right ear.  Has green nasal drainage and post nasal drip.  History of prior sinusitis, largely in fall.  On nasonex with some improvement.  No fever or chills, sore throat, cough.  No wheezing or shortness of breath.  Patient Active Problem List  Diagnosis  . Hypertension  . Allergic rhinitis, mild    Past Medical History  Diagnosis Date  . Allergic rhinitis   . Sleep apnea   . Hypertension   . Hemorrhoids   . Personal history of colonic polyps     Past Surgical History  Procedure Date  . Colonoscopy 2008    Dr. Elnoria Howard  . Hernia repair   . Lasic     History  Substance Use Topics  . Smoking status: Never Smoker   . Smokeless tobacco: Never Used  . Alcohol Use: Yes     occass    Family History  Problem Relation Age of Onset  . Adopted: Yes    No Known Allergies  Medication list has been reviewed and updated.  Current Outpatient Prescriptions on File Prior to Visit  Medication Sig Dispense Refill  . aspirin 81 MG tablet Take 160 mg by mouth daily.      . fish oil-omega-3 fatty acids 1000 MG capsule Take 2 g by mouth daily.      Marland Kitchen losartan-hydrochlorothiazide (HYZAAR) 100-12.5 MG per tablet Take 1 tablet by mouth daily.  30 tablet  11  . mometasone (NASONEX) 50 MCG/ACT nasal spray Place 2 sprays into the nose daily.  17 g   12  . Multiple Vitamins-Minerals (MULTIVITAMIN WITH MINERALS) tablet Take 1 tablet by mouth daily.      . meloxicam (MOBIC) 15 MG tablet TAKE 1 TABLET (15 MG TOTAL) BY MOUTH DAILY.  30 tablet  0    Review of Systems:  As per HPI, otherwise negative.    Physical Examination: Filed Vitals:   06/08/12 1742  BP: 174/94  Pulse: 80  Temp: 98.1 F (36.7 C)  Resp: 16   Filed Vitals:   06/08/12 1742  Height: 5\' 10"  (1.778 m)  Weight: 192 lb (87.091 kg)   Body mass index is 27.55 kg/(m^2). Ideal Body Weight: Weight in (lb) to have BMI = 25: 173.9   GEN: WDWN, NAD, Non-toxic, A & O x 3  No rash or sepsis HEENT: Atraumatic, Normocephalic. Neck supple. No masses, No LAD.  Oropharynx clear Ears and Nose: No external deformity.  TM clear bilaterally.  Right maxillary sinus tender.  Moderate purulent drainage CV: RRR, No M/G/R. No JVD. No thrill. No extra heart sounds. PULM: CTA B, no wheezes, crackles, rhonchi. No retractions. No resp. distress. No accessory muscle use. ABD: S, NT, ND, +BS. No rebound. No HSM. EXTR: No c/c/e.  Full active ROM painlessly left shoulder  NEURO Normal gait.  PSYCH: Normally interactive. Conversant. Not depressed or anxious appearing.  Calm demeanor.    Assessment and Plan: Sinusitis augmentin mucinex d  Rotator cuff sprain Maximal improvement with PT  Carmelina Dane, MD

## 2012-06-20 NOTE — Progress Notes (Signed)
Reviewed and agree.

## 2012-09-04 ENCOUNTER — Other Ambulatory Visit: Payer: Self-pay | Admitting: Family Medicine

## 2012-09-09 ENCOUNTER — Other Ambulatory Visit: Payer: Self-pay | Admitting: Family Medicine

## 2012-12-15 ENCOUNTER — Other Ambulatory Visit: Payer: Self-pay | Admitting: Family Medicine

## 2013-01-27 ENCOUNTER — Ambulatory Visit (INDEPENDENT_AMBULATORY_CARE_PROVIDER_SITE_OTHER): Payer: 59 | Admitting: Family Medicine

## 2013-01-27 ENCOUNTER — Encounter: Payer: Self-pay | Admitting: Family Medicine

## 2013-01-27 VITALS — HR 74 | Wt 201.0 lb

## 2013-01-27 DIAGNOSIS — I10 Essential (primary) hypertension: Secondary | ICD-10-CM

## 2013-01-27 DIAGNOSIS — J019 Acute sinusitis, unspecified: Secondary | ICD-10-CM

## 2013-01-27 DIAGNOSIS — J309 Allergic rhinitis, unspecified: Secondary | ICD-10-CM

## 2013-01-27 MED ORDER — AMOXICILLIN-POT CLAVULANATE 875-125 MG PO TABS: 1.0000 | ORAL_TABLET | Freq: Two times a day (BID) | ORAL | Status: DC

## 2013-01-27 MED ORDER — AMLODIPINE BESYLATE 5 MG PO TABS: 5.0000 mg | ORAL_TABLET | Freq: Every day | ORAL | Status: DC

## 2013-01-27 MED ORDER — BUDESONIDE 32 MCG/ACT NA SUSP: 2.0000 | Freq: Every day | NASAL | Status: DC

## 2013-01-27 NOTE — Progress Notes (Signed)
  Subjective:    Patient ID: Jeffrey Forbes, male    DOB: November 14, 1950, 62 y.o.   MRN: [id number]  HPI He is here for recheck. He did have recent difficulty with sinus infection. He has a previous history of difficulty with this. He was seen in an urgent care Center and given Amoxil which helped but did not clear. Presently he is still having difficulty with sinus congestion, postnasal drainage with some nasal bleeding, malaise and facial pain.He does have an underlying history of allergies. He has been on Flonase and most recently Nasonex for his allergies both which cause difficulty with nasal bleeding.   Review of Systems     Objective:   Physical Exam alert and in no distress. Tympanic membranes and canals are normal. Throat is clear. Tonsils are normal. Neck is supple without adenopathy or thyromegaly. Cardiac exam shows a regular sinus rhythm without murmurs or gallops. Lungs are clear to auscultation. Nasal mucosa is slightly red with tenderness especially over ethmoid sinuses       Assessment & Plan:  Allergic rhinitis, mild - Plan: budesonide (RHINOCORT AQUA) 32 MCG/ACT nasal spray  Hypertension - Plan: amLODipine (NORVASC) 5 MG tablet  Acute sinusitis - Plan: amoxicillin-clavulanate (AUGMENTIN) 875-125 MG per tablet his blood pressure is not under good control and I will therefore add Norvasc. Also switch to Rhinocort and see if he can tolerate this. Augmentin has worked in the past and I will therefore use this. Return here in one month.

## 2013-02-12 ENCOUNTER — Telehealth: Payer: Self-pay | Admitting: Internal Medicine

## 2013-02-12 DIAGNOSIS — J309 Allergic rhinitis, unspecified: Secondary | ICD-10-CM

## 2013-02-12 DIAGNOSIS — I10 Essential (primary) hypertension: Secondary | ICD-10-CM

## 2013-02-12 MED ORDER — BUDESONIDE 32 MCG/ACT NA SUSP: 2.0000 | Freq: Every day | NASAL | Status: DC

## 2013-02-12 MED ORDER — AMLODIPINE BESYLATE 5 MG PO TABS: 5.0000 mg | ORAL_TABLET | Freq: Every day | ORAL | Status: DC

## 2013-02-12 MED ORDER — LOSARTAN POTASSIUM-HCTZ 100-12.5 MG PO TABS: ORAL_TABLET | ORAL | Status: DC

## 2013-02-12 NOTE — Telephone Encounter (Signed)
SENT MED REFILL FOR 1 YEAR

## 2013-02-12 NOTE — Telephone Encounter (Signed)
Pt needs a refill on losartan-hctz 100-12.5mg  to [facility] in [place]

## 2013-02-20 ENCOUNTER — Ambulatory Visit: Payer: 59 | Admitting: Family Medicine

## 2013-02-21 ENCOUNTER — Encounter: Payer: Self-pay | Admitting: Family Medicine

## 2013-02-21 ENCOUNTER — Ambulatory Visit (INDEPENDENT_AMBULATORY_CARE_PROVIDER_SITE_OTHER): Payer: 59 | Admitting: Family Medicine

## 2013-02-21 VITALS — Wt 200.0 lb

## 2013-02-21 DIAGNOSIS — L02219 Cutaneous abscess of trunk, unspecified: Secondary | ICD-10-CM

## 2013-02-21 DIAGNOSIS — L02211 Cutaneous abscess of abdominal wall: Secondary | ICD-10-CM

## 2013-02-21 NOTE — Progress Notes (Signed)
  Subjective:    Patient ID: Jeffrey Forbes, male    DOB: 1951-07-04, 62 y.o.   MRN: [id number]  HPI He complains of intermittent swelling and drainage from a lower mid abdominal lesion. He stated they did get purulent drainage from it. Usually it will manifest itself in one or 2 days and then quiet down. He has been on antibiotics during the same timeframe which did not totally cleared up.  Review of Systems     Objective:   Physical Exam He has a linear 1 cm long lesion with no induration or erythema. This is several centimeters below the umbilicus       Assessment & Plan:  Abdominal wall abscess  recommend he return here when the lesion swells up again for more definitive care. I think the lesion will need to be surgically excised as opposed to I and D.

## 2013-02-21 NOTE — Patient Instructions (Addendum)
When it swells up again set up an appointment for more definitive care

## 2013-02-27 ENCOUNTER — Ambulatory Visit (INDEPENDENT_AMBULATORY_CARE_PROVIDER_SITE_OTHER): Payer: 59 | Admitting: Family Medicine

## 2013-02-27 ENCOUNTER — Encounter: Payer: Self-pay | Admitting: Family Medicine

## 2013-02-27 VITALS — BP 138/76 | HR 90 | Wt 201.0 lb

## 2013-02-27 DIAGNOSIS — I10 Essential (primary) hypertension: Secondary | ICD-10-CM

## 2013-02-27 DIAGNOSIS — J309 Allergic rhinitis, unspecified: Secondary | ICD-10-CM

## 2013-02-27 NOTE — Progress Notes (Signed)
  Subjective:    Patient ID: Jeffrey Forbes, male    DOB: [birth year], 62 y.o.   MRN: [id number]  HPI He is here for recheck. He states that his sinus infection is gone. The new nasal spray is also working. He is not having any nosebleeds.  Review of Systems     Objective:   Physical Exam Alert and in no distress. Blood pressure is recorded       Assessment & Plan:  Hypertension  Allergic rhinitis, mild  continue present medication regimen

## 2013-03-13 ENCOUNTER — Encounter: Payer: Self-pay | Admitting: Internal Medicine

## 2013-03-13 LAB — HM COLONOSCOPY

## 2013-03-25 ENCOUNTER — Encounter: Payer: Self-pay | Admitting: Family Medicine

## 2013-05-29 ENCOUNTER — Ambulatory Visit (INDEPENDENT_AMBULATORY_CARE_PROVIDER_SITE_OTHER): Payer: 59 | Admitting: Family Medicine

## 2013-05-29 VITALS — BP 150/90 | HR 72 | Temp 98.6°F

## 2013-05-29 DIAGNOSIS — Z0000 Encounter for general adult medical examination without abnormal findings: Secondary | ICD-10-CM

## 2013-05-29 DIAGNOSIS — J309 Allergic rhinitis, unspecified: Secondary | ICD-10-CM

## 2013-05-29 DIAGNOSIS — K635 Polyp of colon: Secondary | ICD-10-CM | POA: Insufficient documentation

## 2013-05-29 DIAGNOSIS — G4733 Obstructive sleep apnea (adult) (pediatric): Secondary | ICD-10-CM | POA: Insufficient documentation

## 2013-05-29 DIAGNOSIS — I10 Essential (primary) hypertension: Secondary | ICD-10-CM

## 2013-05-29 DIAGNOSIS — D126 Benign neoplasm of colon, unspecified: Secondary | ICD-10-CM

## 2013-05-29 DIAGNOSIS — G4730 Sleep apnea, unspecified: Secondary | ICD-10-CM

## 2013-05-29 LAB — COMPREHENSIVE METABOLIC PANEL WITH GFR
ALT: 31 U/L (ref 0–53)
AST: 22 U/L (ref 0–37)
Albumin: 4.7 g/dL (ref 3.5–5.2)
Alkaline Phosphatase: 85 U/L (ref 39–117)
BUN: 11 mg/dL (ref 6–23)
CO2: 33 meq/L — ABNORMAL HIGH (ref 19–32)
Calcium: 9 mg/dL (ref 8.4–10.5)
Chloride: 99 meq/L (ref 96–112)
Creat: 0.95 mg/dL (ref 0.50–1.35)
Glucose, Bld: 90 mg/dL (ref 70–99)
Potassium: 3.2 meq/L — ABNORMAL LOW (ref 3.5–5.3)
Sodium: 139 meq/L (ref 135–145)
Total Bilirubin: 0.7 mg/dL (ref 0.3–1.2)
Total Protein: 6.7 g/dL (ref 6.0–8.3)

## 2013-05-29 LAB — CBC WITH DIFFERENTIAL/PLATELET
Basophils Absolute: 0 K/uL (ref 0.0–0.1)
Basophils Relative: 1 % (ref 0–1)
Eosinophils Absolute: 0.3 K/uL (ref 0.0–0.7)
Eosinophils Relative: 5 % (ref 0–5)
HCT: 42.6 % (ref 39.0–52.0)
Hemoglobin: 15.3 g/dL (ref 13.0–17.0)
Lymphocytes Relative: 20 % (ref 12–46)
Lymphs Abs: 1.3 K/uL (ref 0.7–4.0)
MCH: 32.5 pg (ref 26.0–34.0)
MCHC: 35.9 g/dL (ref 30.0–36.0)
MCV: 90.4 fL (ref 78.0–100.0)
Monocytes Absolute: 0.9 K/uL (ref 0.1–1.0)
Monocytes Relative: 14 % — ABNORMAL HIGH (ref 3–12)
Neutro Abs: 4 K/uL (ref 1.7–7.7)
Neutrophils Relative %: 60 % (ref 43–77)
Platelets: 185 K/uL (ref 150–400)
RBC: 4.71 MIL/uL (ref 4.22–5.81)
RDW: 13.5 % (ref 11.5–15.5)
WBC: 6.5 K/uL (ref 4.0–10.5)

## 2013-05-29 LAB — POCT URINALYSIS DIPSTICK
Blood, UA: NEGATIVE
Glucose, UA: NEGATIVE
Ketones, UA: NEGATIVE
Protein, UA: NEGATIVE
Spec Grav, UA: 1.01
Urobilinogen, UA: NEGATIVE

## 2013-05-29 LAB — LIPID PANEL
Cholesterol: 175 mg/dL (ref 0–200)
HDL: 32 mg/dL — ABNORMAL LOW
LDL Cholesterol: 90 mg/dL (ref 0–99)
Total CHOL/HDL Ratio: 5.5 ratio
Triglycerides: 264 mg/dL — ABNORMAL HIGH
VLDL: 53 mg/dL — ABNORMAL HIGH (ref 0–40)

## 2013-05-29 MED ORDER — MOMETASONE FUROATE 50 MCG/ACT NA SUSP: 2.0000 | Freq: Every day | NASAL | Status: DC

## 2013-05-29 NOTE — Progress Notes (Signed)
  Subjective:    Patient ID: Jeffrey Forbes, male    DOB: 1950-10-15, 62 y.o.   MRN: [id number]  HPI He is here for complete examination. He does have an underlying history of allergic rhinitis and occasional difficulty with sinusitis. He has tried Flonase in the past but has cause difficulty with nose bleeding. He would like to be placed back on Nasonex which has worked well in the past. He also has sleep apnea but has not had a CPAP readout in quite some time.He continues on his blood pressure medications. He has had a colonoscopy which did show tubular adenoma. Social and family history were reviewed. 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  Review of Systems Negative except as above    Objective:   Physical Exam BP 150/90  Pulse 72  Temp(Src) 98.6 F (37 C) (Oral)  SpO2 97%  General Appearance:    Alert, cooperative, no distress, appears stated age  Head:    Normocephalic, without obvious abnormality, atraumatic  Eyes:    PERRL, conjunctiva/corneas clear, EOM's intact, fundi    benign  Ears:    Normal TM's and external ear canals  Nose:   Nares normal, mucosa normal, no drainage or sinus   tenderness  Throat:   Lips, mucosa, and tongue normal; teeth and gums normal  Neck:   Supple, no lymphadenopathy;  thyroid:  no   enlargement/tenderness/nodules; no carotid   bruit or JVD  Back:    Spine nontender, no curvature, ROM normal, no CVA     tenderness  Lungs:     Clear to auscultation bilaterally without wheezes, rales or     ronchi; respirations unlabored  Chest Wall:    No tenderness or deformity   Heart:    Regular rate and rhythm, S1 and S2 normal, no murmur, rub   or gallop  Breast Exam:    No chest wall tenderness, masses or gynecomastia  Abdomen:     Soft, non-tender, nondistended, normoactive bowel sounds,    no masses, no hepatosplenomegaly        Extremities:   No clubbing, cyanosis or edema  Pulses:   2+ and symmetric all extremities  Skin:   Skin color,  texture, turgor normal, no rashes or lesions  Lymph nodes:   Cervical, supraclavicular, and axillary nodes normal  Neurologic:   CNII-XII intact, normal strength, sensation and gait; reflexes 2+ and symmetric throughout          Psych:   Normal mood, affect, hygiene and grooming.          Assessment & Plan:  Routine general medical examination at a health care facility - Plan: CBC with Differential, Comprehensive metabolic panel, Lipid panel  Hypertension - Plan: POCT Urinalysis Dipstick  Allergic rhinitis, mild - Plan: mometasone (NASONEX) 50 MCG/ACT nasal spray  Colonic polyp  Sleep apnea  I will attempt to get a CPAP readout earache he would place on Nasonex. Continue on other medications.

## 2013-05-30 ENCOUNTER — Encounter: Payer: Self-pay | Admitting: Family Medicine

## 2013-06-02 NOTE — Progress Notes (Signed)
Quick Note:  MAILED PT LETTER OF LABS WITH DIET INFO ______

## 2013-07-15 ENCOUNTER — Telehealth: Payer: Self-pay

## 2013-07-15 NOTE — Telephone Encounter (Signed)
I GOT A MESSAGE FROM BILL THIS MORNING WANTING TO KNOW ABOUT THE CPAP READOUT SAID HE IS MORE TIRED NOW DOESN'T THINK CPAP IS WORKING RIGHT . I HAVE LOOKED IN CHART AND SEE NO RESULTS SO I CALLED MELISSA AT [facility] SHE APOLOGIZED  THEY HAVE NOT SENT TO [place] THE REPORT YET SO THIS IS ALL JUST AN FYI SHE IS FAXING RESULTS NOW

## 2013-07-17 ENCOUNTER — Telehealth: Payer: Self-pay | Admitting: Family Medicine

## 2013-07-17 NOTE — Telephone Encounter (Signed)
I HAVE CALLED PT TO INFORM HIM JCL WANTS TO DO ANOTHER SLEEP STUDY HE AGREED AND WANTED IT DONE IN [place] SO I HAVE FAXED REFERRAL TO SLEEP SERVICES IN [facility] TELL# IS [phone] FAX# IS [phone]

## 2013-07-17 NOTE — Telephone Encounter (Signed)
Work with the DME company to see if we can get a sleep study to determine proper CPAP setting.

## 2013-08-05 ENCOUNTER — Other Ambulatory Visit: Payer: Self-pay | Admitting: Family Medicine

## 2013-10-29 ENCOUNTER — Encounter: Payer: Self-pay | Admitting: Family Medicine

## 2013-10-30 ENCOUNTER — Telehealth: Payer: Self-pay | Admitting: Internal Medicine

## 2013-10-30 NOTE — Telephone Encounter (Signed)
Pt states that he is still not waking up refreshed that he could fall asleep at a stop light, or stop sign and just wants to press the snooze button over and over again. Pt gets about 5-6 hours a sleep and he wants to know what else does he need to do

## 2013-10-31 NOTE — Telephone Encounter (Signed)
Jeffrey Forbes

## 2013-10-31 NOTE — Telephone Encounter (Signed)
Make sure his CPAP set at 8 and if so, set him up to see Lucia Estelle for a consult concerning his sleep apnea

## 2013-11-03 NOTE — Telephone Encounter (Signed)
Pt scheduled to see Dr. Baird Lyons 12/11/13 at 2:45 regarding sleep apnea

## 2013-11-27 ENCOUNTER — Ambulatory Visit (INDEPENDENT_AMBULATORY_CARE_PROVIDER_SITE_OTHER): Payer: 59 | Admitting: Family Medicine

## 2013-11-27 ENCOUNTER — Encounter: Payer: Self-pay | Admitting: Family Medicine

## 2013-11-27 VITALS — BP 140/84 | HR 84 | Temp 98.3°F | Ht 71.0 in | Wt 199.0 lb

## 2013-11-27 DIAGNOSIS — J069 Acute upper respiratory infection, unspecified: Secondary | ICD-10-CM

## 2013-11-27 MED ORDER — AMOXICILLIN 500 MG PO TABS: 1000.0000 mg | ORAL_TABLET | Freq: Two times a day (BID) | ORAL | Status: DC

## 2013-11-27 NOTE — Patient Instructions (Signed)
  Drink plenty of fluids. Sinus rinses once or twice daily as needed Use mucinex as directed  Use tylenol or ibuprofen as needed for pain, fever  Expect symptoms to improve over the next 2-5 days; if symptoms progressively get worse, rather than improve, start the antibiotics.  You must complete the entire course.  Call or return if symptoms do not improve (or if worse)

## 2013-11-27 NOTE — Progress Notes (Signed)
Chief Complaint  Patient presents with  . Cough    started with a sore throat Monday. Has progressed to cough that does produce some discolored mucus. Sinus pain and pressure. No fevers.    3 days ago he woke up with sore throat, then developed head congestion, sinus pain, postnasal drainage.  Now having some deeper/hoarse voice, chest congestion, cough.  He is having pain across forehead, between eyes, and both cheeks. Unsure of color of nasal drainage.  Cough is productive of yellow-greenish mucus, small amounts.  He feels winded after a coughing spell.  He felt winded after walking up steps at home today.  He has been on the zyrtec long-term.  Restarted the nasal steroid spray about 2.5 weeks ago.  He started Mucinex on Monday (plain, 4 hr version, taking 3-4x/day)  Past Medical History  Diagnosis Date  . Allergic rhinitis   . Sleep apnea   . Hypertension   . Hemorrhoids   . Personal history of colonic polyps    Past Surgical History  Procedure Laterality Date  . Colonoscopy  2008    Dr. Benson Norway  . Hernia repair    . Lasik     History   Social History  . Marital Status: Single    Spouse Name: N/A    Number of Children: N/A  . Years of Education: N/A   Occupational History  . 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 Smoking status: Never Smoker   . Smokeless tobacco: Never Used  . Alcohol Use: Yes     Comment: occasional (1-2/month)  . Drug Use: No  . Sexual Activity: Not on file   Other Topics Concern  . Not on file   Social History Narrative  . No narrative on file    Outpatient Encounter Prescriptions as of 11/27/2013  Medication Sig  . amLODipine (NORVASC) 5 MG tablet Take 1 tablet (5 mg total) by mouth daily.  Marland Kitchen aspirin 81 MG tablet Take 160 mg by mouth daily.  . cetirizine (ZYRTEC) 10 MG tablet Take 10 mg by mouth daily.  . fish oil-omega-3 fatty acids 1000 MG capsule Take 2 g by mouth daily.  . GuaiFENesin (MUCINEX PO) Take 1  tablet by mouth every 4 (four) hours as needed.  Marland Kitchen losartan-hydrochlorothiazide (HYZAAR) 100-12.5 MG per tablet TAKE 1 TABLET BY MOUTH DAILY.  . mometasone (NASONEX) 50 MCG/ACT nasal spray Place 2 sprays into the nose daily.  . Multiple Vitamins-Minerals (MULTIVITAMIN WITH MINERALS) tablet Take 1 tablet by mouth daily.   No Known Allergies  ROS: Denies fevers, chills, myalgias, nausea, vomiting, bleeding, bruising or skin rashes.  Denies abdominal pain, joint pains, chest pain, palpitations. +URI symptoms as per HPI  PHYSICAL EXAM: BP 140/84  Pulse 84  Temp(Src) 98.3 F (36.8 C) (Oral)  Ht 5\' 11"  (1.803 m)  Wt 199 lb (90.266 kg)  BMI 27.77 kg/m2 Well developed, pleasant male in no distress.  Rare cough HEENT:  PERRL, EOMI, conjunctiva clear.  TM's and EAC's normal. Nasal mucosa moderately edematous, R>L, no significant erythema, no purulence.  Sinuses nontender.  OP clear Neck: no lymphadenopathy or mass Heart: regular rate and rhythm without murmur Lungs: clear bilaterally.  No wheeze, rales, ronchi, good air movement Extremities: no edema Skin: no rash, bruising  ASSESSMENT/PLAN:  Acute upper respiratory infections of unspecified site - Plan: amoxicillin (AMOXIL) 500 MG tablet  Suspect viral.  Reviewed normal course. Start ABX if symptoms persist/worsen.   Drink plenty  of fluids. Sinus rinses once or twice daily as needed Use mucinex as directed  Use tylenol or ibuprofen as needed for pain, fever  Expect symptoms to improve over the next 2-5 days; if symptoms progressively get worse, rather than improve, start the antibiotics.  You must complete the entire course.  Call or return if symptoms do not improve (or if worse)

## 2013-12-10 ENCOUNTER — Telehealth: Payer: Self-pay | Admitting: Internal Medicine

## 2013-12-10 NOTE — Telephone Encounter (Signed)
Error.  Wrong patient.  Jeffrey Forbes ° °

## 2013-12-11 ENCOUNTER — Encounter: Payer: Self-pay | Admitting: Internal Medicine

## 2013-12-11 ENCOUNTER — Ambulatory Visit (INDEPENDENT_AMBULATORY_CARE_PROVIDER_SITE_OTHER): Payer: 59 | Admitting: Internal Medicine

## 2013-12-11 VITALS — BP 132/88 | HR 75 | Ht 71.0 in | Wt 200.0 lb

## 2013-12-11 DIAGNOSIS — J209 Acute bronchitis, unspecified: Secondary | ICD-10-CM

## 2013-12-11 DIAGNOSIS — G4733 Obstructive sleep apnea (adult) (pediatric): Secondary | ICD-10-CM

## 2013-12-11 NOTE — Patient Instructions (Addendum)
Order- [facility]ME [facility]- Autotitrate CPAP 5-15 cwp x 7 days, also mask of choice, supplies as needed    Dx OSA  Work note for today

## 2013-12-11 NOTE — Progress Notes (Signed)
12/11/13- [name] never smoker seen on kind referral by  Dr. Jill Alexanders for obstructive sleep apnea.  New sleep study 10/2013 and wearing CPAP 8/ Advanced 6 hours per night.  Epworth score 19 Original diagnosis 2005. Sleeps better with CPAP but still waking tired.Has new CPAP machine 8 cwp/ [facility], nasal pillows mask. Bedtime 1:30-2:30, short latency, up 8-9:00AM.  Not on call at night. 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 Weight was 195 lbs. Treated for HBP, allergic rhinitis. Adopted. Unmarried, living alone with 8 dogs, 1 cat. Additional Problem- DOE and productive cough since a chest cold in early spring. Not improving. Always stuffy, but not obvious sinus infection. No hx asthma. Sputum white.   Prior to Admission medications   Medication Sig Start Date End Date Taking? Authorizing Provider  amLODipine (NORVASC) 5 MG tablet Take 1 tablet (5 mg total) by mouth daily. 02/12/13  Yes Denita Lung, MD  aspirin 81 MG tablet Take 160 mg by mouth daily.   Yes Historical Provider, MD  cetirizine (ZYRTEC) 10 MG tablet Take 10 mg by mouth daily.   Yes Historical Provider, MD  fish oil-omega-3 fatty acids 1000 MG capsule Take 2 g by mouth daily.   Yes Historical Provider, MD  GuaiFENesin (MUCINEX PO) Take 1 tablet by mouth every 4 (four) hours as needed.   Yes Historical Provider, MD  losartan-hydrochlorothiazide (HYZAAR) 100-12.5 MG per tablet TAKE 1 TABLET BY MOUTH DAILY. 08/05/13  Yes Denita Lung, MD  mometasone (NASONEX) 50 MCG/ACT nasal spray Place 2 sprays into the nose daily. 05/29/13  Yes Denita Lung, MD  Multiple Vitamins-Minerals (MULTIVITAMIN WITH MINERALS) tablet Take 1 tablet by mouth daily.   Yes Historical Provider, MD   Past Medical History  Diagnosis Date  . Allergic rhinitis   . Sleep apnea   . Hypertension   . Hemorrhoids   . Personal history of colonic polyps    Past Surgical  History  Procedure Laterality Date  . Colonoscopy  2008    Dr. Benson Norway  . Hernia repair    . Lasik     Family History  Problem Relation Age of Onset  . Adopted: Yes   History   Social History  . Marital Status: Single    Spouse Name: N/A    Number of Children: N/A  . Years of Education: N/A   Occupational History  . 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 Smoking status: Never Smoker   . Smokeless tobacco: Never Used  . Alcohol Use: Yes     Comment: occasional (1-2/month)  . Drug Use: No  . Sexual Activity: Not on file   Other Topics Concern  . Not on file   Social History Narrative  . No narrative on file   ROS-see HPI Constitutional:   No-   weight loss, night sweats, fevers, chills, +fatigue, lassitude. HEENT:   No-  headaches, difficulty swallowing, tooth/dental problems, sore throat,       +sneezing, +itching, ear ache, +nasal congestion, post nasal drip,  CV:  No-   chest pain, orthopnea, PND, swelling in lower extremities, anasarca,                                  dizziness, palpitations Resp: +shortness of breath with exertion or at rest.             +  productive cough,  No non-productive cough,  No- coughing up of blood.              No-   change in color of mucus.  No- wheezing.   Skin: No-   rash or lesions. GI:  No-   heartburn, indigestion, abdominal pain, nausea, vomiting, diarrhea,                 change in bowel habits, loss of appetite GU: No-   dysuria, change in color of urine, no urgency or frequency.  No- flank pain. MS:  No-   joint pain or swelling.  No- decreased range of motion.  No- back pain. Neuro-     nothing unusual Psych:  No- change in mood or affect. No depression or anxiety.  No memory loss.  OBJ- Physical Exam General- Alert, Oriented, Affect-appropriate, Distress- none acute, medium build Skin- rash-none, lesions- none, excoriation- none Lymphadenopathy- none Head- atraumatic            Eyes-  Gross vision intact, PERRLA, conjunctivae and secretions clear            Ears- Hearing, canals-normal            Nose- Clear, no-Septal dev, mucus, polyps, erosion, perforation             Throat- Mallampati II , mucosa clear , drainage- none, tonsils- atrophic Neck- flexible , trachea midline, no stridor , thyroid nl, carotid no bruit Chest - symmetrical excursion , unlabored           Heart/CV- RRR , no murmur , no gallop  , no rub, nl s1 s2                           - JVD- none , edema- none, stasis changes- none, varices- none           Lung- clear to P&A, wheeze- none, cough- none , dullness-none, rub- none           Chest wall-  Abd- tender-no, distended-no, bowel sounds-present, HSM- no Br/ Gen/ Rectal- Not done, not indicated Extrem- cyanosis- none, clubbing, none, atrophy- none, strength- nl Neuro- grossly intact to observation

## 2014-01-07 DIAGNOSIS — J209 Acute bronchitis, unspecified: Secondary | ICD-10-CM | POA: Insufficient documentation

## 2014-01-07 NOTE — Assessment & Plan Note (Signed)
Slowly resolving acute bronchitis. Discussed symptomatic management and when to seek more attention.

## 2014-02-12 ENCOUNTER — Ambulatory Visit (INDEPENDENT_AMBULATORY_CARE_PROVIDER_SITE_OTHER): Payer: 59 | Admitting: Internal Medicine

## 2014-02-12 ENCOUNTER — Encounter: Payer: Self-pay | Admitting: Internal Medicine

## 2014-02-12 VITALS — BP 136/70 | HR 76 | Ht 71.0 in | Wt 198.4 lb

## 2014-02-12 DIAGNOSIS — G4733 Obstructive sleep apnea (adult) (pediatric): Secondary | ICD-10-CM

## 2014-02-12 NOTE — Patient Instructions (Signed)
Order- DME [facility]  Download for pressure compliance assay      Dx OSA  Ask Dr Redmond School about memory assessment and suggestions.

## 2014-02-12 NOTE — Progress Notes (Signed)
12/11/13- [name] never smoker seen on kind referral by  Dr. Jill Alexanders for obstructive sleep apnea.  New sleep study 10/2013 and wearing CPAP 8/ Advanced 6 hours per night.  Epworth score 19 Original diagnosis 2005. Sleeps better with CPAP but still waking tired.Has new CPAP machine 8 cwp/ Advanced, nasal pillows mask. Bedtime 1:30-2:30, short latency, up 8-9:00AM.  Not on call at night. 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 Weight was 195 lbs. Treated for HBP, allergic rhinitis. Adopted. Unmarried, living alone with 8 dogs, 1 cat. Additional Problem- DOE and productive cough since a chest cold in early spring. Not improving. Always stuffy, but not obvious sinus infection. No hx asthma. Sputum white.   02/12/14- [name] never smoker seen on kind referral by  Dr. Jill Alexanders for obstructive sleep apnea.  New sleep study 10/2013 and wearing CPAP 8/ Advanced 6 hours per night.  Epworth score 19 FOLLOWS FOR: Pt states the change in pressure has seemed to help, although pt stated he had "wet his pants" one night. Pt c/o the cpap no longer helping and feels like he did before the pressure change. Pt is woried about his memory, pt "becoming absent minded ". He tells me sleep is comfortable with nasal pillows mask. He is more concerned that he might be getting some dementia.  ROS-see HPI Constitutional:   No-   weight loss, night sweats, fevers, chills, +fatigue, lassitude. HEENT:   No-  headaches, difficulty swallowing, tooth/dental problems, sore throat,       No-sneezing, no-itching, ear ache, +nasal congestion, post nasal drip,  CV:  No-   chest pain, orthopnea, PND, swelling in lower extremities, anasarca,                                  dizziness, palpitations Resp: +shortness of breath with exertion or at rest.             + productive cough,  No non-productive cough,  No- coughing up of blood.    No-   change in color of mucus.  No- wheezing.   Skin: No-   rash or lesions. GI:  No-   heartburn, indigestion, abdominal pain, nausea, vomiting,  GU: . MS:  No-   joint pain or swelling.   Neuro-     nothing unusual Psych:  No- change in mood or affect. No depression or anxiety.  ?s memory loss.  OBJ- Physical Exam General- Alert, Oriented, Affect-appropriate, Distress- none acute, medium build Skin- rash-none, lesions- none, excoriation- none Lymphadenopathy- none Head- atraumatic            Eyes- Gross vision intact, PERRLA, conjunctivae and secretions clear            Ears- Hearing, canals-normal            Nose- Clear, no-Septal dev, mucus, polyps, erosion, perforation             Throat- Mallampati II , mucosa clear , drainage- none, tonsils- atrophic Neck- flexible , trachea midline, no stridor , thyroid nl, carotid no bruit Chest - symmetrical excursion , unlabored           Heart/CV- RRR , no murmur , no gallop  , no rub, nl s1 s2                           -  JVD- none , edema- none, stasis changes- none, varices- none           Lung- clear to P&A, wheeze- none, cough- none , dullness-none, rub- none           Chest wall-  Abd-  Br/ Gen/ Rectal- Not done, not indicated Extrem- cyanosis- none, clubbing, none, atrophy- none, strength- nl Neuro- grossly intact to observation

## 2014-02-19 ENCOUNTER — Other Ambulatory Visit: Payer: Self-pay | Admitting: Family Medicine

## 2014-05-12 ENCOUNTER — Other Ambulatory Visit: Payer: Self-pay | Admitting: Family Medicine

## 2014-05-13 ENCOUNTER — Other Ambulatory Visit: Payer: Self-pay | Admitting: Family Medicine

## 2014-05-30 NOTE — Assessment & Plan Note (Addendum)
Now CPAP 8/Advanced. He has some concerns about effectiveness but much of what he is describing may not be related to sleep/ CPAP Plan-request download. He will ask his PCP about mental status/memory assessment

## 2014-06-15 ENCOUNTER — Encounter: Payer: Self-pay | Admitting: Internal Medicine

## 2014-06-15 ENCOUNTER — Ambulatory Visit (INDEPENDENT_AMBULATORY_CARE_PROVIDER_SITE_OTHER): Payer: 59 | Admitting: Internal Medicine

## 2014-06-15 VITALS — BP 116/80 | HR 82 | Ht 71.0 in | Wt 197.0 lb

## 2014-06-15 DIAGNOSIS — G4733 Obstructive sleep apnea (adult) (pediatric): Secondary | ICD-10-CM

## 2014-06-15 NOTE — Assessment & Plan Note (Signed)
By our records he has been compliant and well controlled on CPAP 8/ Advanced. He isn't sure if his pulmonologist, Dr Welford Roche, may have changed that. I have invited him to shift this management to Dr Welford Roche, since that office is more convenient. I will be happy to see him again if needed.

## 2014-06-15 NOTE — Patient Instructions (Signed)
According to our records you are still on CPAP 8/ Advanced. That can be managed going forward by your [facility] Pulmonologist.  We will be happy to see you again if needed.

## 2014-06-15 NOTE — Progress Notes (Signed)
12/11/13- [name] never smoker seen on kind referral by  Dr. Jill Alexanders for obstructive sleep apnea.  New sleep study 10/2013 and wearing CPAP 8/ Advanced 6 hours per night.  Epworth score 19 Original diagnosis 2005. Sleeps better with CPAP but still waking tired.Has new CPAP machine 8 cwp/ Advanced, nasal pillows mask. Bedtime 1:30-2:30, short latency, up 8-9:00AM.  Not on call at night. 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 Weight was 195 lbs. Treated for HBP, allergic rhinitis. Adopted. Unmarried, living alone with 8 dogs, 1 cat. Additional Problem- DOE and productive cough since a chest cold in early spring. Not improving. Always stuffy, but not obvious sinus infection. No hx asthma. Sputum white.   02/12/14- [name] never smoker seen on kind referral by  Dr. Jill Alexanders for obstructive sleep apnea.  New sleep study 10/2013 and wearing CPAP 8/ Advanced 6 hours per night.  Epworth score 19 FOLLOWS FOR: Pt states the change in pressure has seemed to help, although pt stated he had "wet his pants" one night. Pt c/o the cpap no longer helping and feels like he did before the pressure change. Pt is woried about his memory, pt "becoming absent minded ". He tells me sleep is comfortable with nasal pillows mask. He is more concerned that he might be getting some dementia.  06/15/14- 63 yoM never smoker followed for OSA, complicated by HBP,  Rhinitis, bronchitis PCP Dr Redmond School  FOLLOWS FOR: OSA, patient reports doing better. He wears CPAP 8/ Advanced  6-7 hours nightly. Since last here he went to [facility] x 2 for bronchitis and they directed him to [facility] Dr Welford Roche who has done CT and multiple other tests, and may have changed CPAP settings. 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 Marland Kitchen   ROS-see HPI Constitutional:   No-   weight loss, night sweats, fevers,  chills, +fatigue, lassitude. HEENT:   No-  headaches, difficulty swallowing, tooth/dental problems, sore throat,       No-sneezing, no-itching, ear ache, +nasal congestion, post nasal drip,  CV:  No-   chest pain, orthopnea, PND, swelling in lower extremities, anasarca,                                  dizziness, palpitations Resp: +shortness of breath with exertion or at rest.             No- productive cough,  No non-productive cough,  No- coughing up of blood.              No-   change in color of mucus.  No- wheezing.   Skin: No-   rash or lesions. GI:  No-   heartburn, indigestion, abdominal pain, nausea, vomiting,  GU: . MS:  No-   joint pain or swelling.   Neuro-     nothing unusual Psych:  No- change in mood or affect. No depression or anxiety.  ?s memory loss.  OBJ- Physical Exam General- Alert, Oriented, Affect-appropriate, Distress- none acute, medium build Skin- rash-none, lesions- none, excoriation- none Lymphadenopathy- none Head- atraumatic            Eyes- Gross vision intact, PERRLA, conjunctivae and secretions clear            Ears- Hearing, canals-normal  Nose- Clear, no-Septal dev, mucus, polyps, erosion, perforation             Throat- Mallampati II , mucosa clear , drainage- none, tonsils- atrophic Neck- flexible , trachea midline, no stridor , thyroid nl, carotid no bruit Chest - symmetrical excursion , unlabored           Heart/CV- RRR , no murmur , no gallop  , no rub, nl s1 s2                           - JVD- none , edema- none, stasis changes- none, varices- none           Lung- clear to P&A, wheeze- none, cough- none , dullness-none, rub- none           Chest wall-  Abd-  Br/ Gen/ Rectal- Not done, not indicated Extrem- cyanosis- none, clubbing, none, atrophy- none, strength- nl Neuro- grossly intact to observation

## 2014-08-13 ENCOUNTER — Other Ambulatory Visit: Payer: Self-pay | Admitting: Family Medicine

## 2014-09-17 ENCOUNTER — Encounter: Payer: Self-pay | Admitting: Family Medicine

## 2014-09-17 ENCOUNTER — Ambulatory Visit (INDEPENDENT_AMBULATORY_CARE_PROVIDER_SITE_OTHER): Payer: 59 | Admitting: Family Medicine

## 2014-09-17 VITALS — BP 114/80 | HR 62 | Ht 70.0 in | Wt 194.0 lb

## 2014-09-17 DIAGNOSIS — G4733 Obstructive sleep apnea (adult) (pediatric): Secondary | ICD-10-CM

## 2014-09-17 DIAGNOSIS — J4531 Mild persistent asthma with (acute) exacerbation: Secondary | ICD-10-CM

## 2014-09-17 DIAGNOSIS — R972 Elevated prostate specific antigen [PSA]: Secondary | ICD-10-CM

## 2014-09-17 DIAGNOSIS — Z0000 Encounter for general adult medical examination without abnormal findings: Secondary | ICD-10-CM

## 2014-09-17 DIAGNOSIS — I10 Essential (primary) hypertension: Secondary | ICD-10-CM

## 2014-09-17 DIAGNOSIS — J309 Allergic rhinitis, unspecified: Secondary | ICD-10-CM

## 2014-09-17 DIAGNOSIS — Z23 Encounter for immunization: Secondary | ICD-10-CM

## 2014-09-17 LAB — POCT URINALYSIS DIPSTICK
BILIRUBIN UA: NEGATIVE
Blood, UA: NEGATIVE
Glucose, UA: NEGATIVE
Ketones, UA: NEGATIVE
Leukocytes, UA: NEGATIVE
Nitrite, UA: NEGATIVE
PH UA: 7.5
PROTEIN UA: NEGATIVE
Spec Grav, UA: 1.015
Urobilinogen, UA: NEGATIVE

## 2014-09-17 LAB — LIPID PANEL
CHOLESTEROL: 177 mg/dL (ref 0–200)
HDL: 43 mg/dL (ref >39)
LDL Cholesterol: 100 mg/dL — ABNORMAL HIGH (ref 0–99)
Total CHOL/HDL Ratio: 4.1 Ratio
Triglycerides: 169 mg/dL — ABNORMAL HIGH (ref <150)
VLDL: 34 mg/dL (ref 0–40)

## 2014-09-17 LAB — COMPREHENSIVE METABOLIC PANEL
ALBUMIN: 4.6 g/dL (ref 3.5–5.2)
ALK PHOS: 74 U/L (ref 39–117)
ALT: 19 U/L (ref 0–53)
AST: 19 U/L (ref 0–37)
BUN: 14 mg/dL (ref 6–23)
CALCIUM: 9.2 mg/dL (ref 8.4–10.5)
CO2: 32 meq/L (ref 19–32)
Chloride: 103 mEq/L (ref 96–112)
Creat: 0.87 mg/dL (ref 0.50–1.35)
Glucose, Bld: 88 mg/dL (ref 70–99)
POTASSIUM: 3.3 meq/L — AB (ref 3.5–5.3)
SODIUM: 143 meq/L (ref 135–145)
Total Bilirubin: 0.7 mg/dL (ref 0.2–1.2)
Total Protein: 6.8 g/dL (ref 6.0–8.3)

## 2014-09-17 LAB — CBC WITH DIFFERENTIAL/PLATELET
BASOS PCT: 1 % (ref 0–1)
Basophils Absolute: 0.1 10*3/uL (ref 0.0–0.1)
EOS PCT: 4 % (ref 0–5)
Eosinophils Absolute: 0.2 10*3/uL (ref 0.0–0.7)
HCT: 42.4 % (ref 39.0–52.0)
Hemoglobin: 14.5 g/dL (ref 13.0–17.0)
Lymphocytes Relative: 34 % (ref 12–46)
Lymphs Abs: 2.1 10*3/uL (ref 0.7–4.0)
MCH: 30.6 pg (ref 26.0–34.0)
MCHC: 34.2 g/dL (ref 30.0–36.0)
MCV: 89.5 fL (ref 78.0–100.0)
MPV: 10.4 fL (ref 8.6–12.4)
Monocytes Absolute: 0.6 10*3/uL (ref 0.1–1.0)
Monocytes Relative: 9 % (ref 3–12)
NEUTROS ABS: 3.2 10*3/uL (ref 1.7–7.7)
Neutrophils Relative %: 52 % (ref 43–77)
PLATELETS: 205 10*3/uL (ref 150–400)
RBC: 4.74 MIL/uL (ref 4.22–5.81)
RDW: 13.5 % (ref 11.5–15.5)
WBC: 6.2 10*3/uL (ref 4.0–10.5)

## 2014-09-17 MED ORDER — AMLODIPINE BESYLATE 5 MG PO TABS: ORAL_TABLET | ORAL | Status: DC

## 2014-09-17 MED ORDER — LOSARTAN POTASSIUM-HCTZ 100-12.5 MG PO TABS: 1.0000 | ORAL_TABLET | Freq: Every day | ORAL | Status: DC

## 2014-09-17 NOTE — Progress Notes (Signed)
Subjective:    Patient ID: Jeffrey Forbes, male    DOB: 18-Sep-1950, 64 y.o.   MRN: [id number]  HPI He is here for complete examination. He has had difficulty recently with urinary tract symptoms and has been initially on Cipro and then switch to doxycycline. During that interval of a did do a PSA which was elevated and he is referred to urology. They placed him on Flomax and he has a scheduled appointment next week for that. He also was found to have asthma and presently is on medications for this. He is following up with a pulmonologist in [facility]. This seems to be under good control. He continues on his CPAP and does get regular readouts on that. His allergies are under good control. He is dating someone and has had recent STD testing. His family and social history was reviewed. His immunizations and health maintenance were also reviewed.   Review of Systems  All other systems reviewed and are negative.      Objective:   Physical Exam BP 114/80 mmHg  Pulse 62  Ht 5\' 10"  (1.778 m)  Wt 194 lb (87.998 kg)  BMI 27.84 kg/m2  SpO2 95%  General Appearance:    Alert, cooperative, no distress, appears stated age  Head:    Normocephalic, without obvious abnormality, atraumatic  Eyes:    PERRL, conjunctiva/corneas clear, EOM's intact, fundi    benign  Ears:    Normal TM's and external ear canals  Nose:   Nares normal, mucosa normal, no drainage or sinus   tenderness  Throat:   Lips, mucosa, and tongue normal; teeth and gums normal  Neck:   Supple, no lymphadenopathy;  thyroid:  no   enlargement/tenderness/nodules; no carotid   bruit or JVD  Back:    Spine nontender, no curvature, ROM normal, no CVA     tenderness  Lungs:     Clear to auscultation bilaterally without wheezes, rales or     ronchi; respirations unlabored  Chest Wall:    No tenderness or deformity   Heart:    Regular rate and rhythm, S1 and S2 normal, no murmur, rub   or gallop  Breast Exam:    No chest wall tenderness,  masses or gynecomastia  Abdomen:     Soft, non-tender, nondistended, normoactive bowel sounds,    no masses, no hepatosplenomegaly        Extremities:   No clubbing, cyanosis or edema  Pulses:   2+ and symmetric all extremities  Skin:   Skin color, texture, turgor normal, no rashes or lesions  Lymph nodes:   Cervical, supraclavicular, and axillary nodes normal  Neurologic:   CNII-XII intact, normal strength, sensation and gait; reflexes 2+ and symmetric throughout          Psych:   Normal mood, affect, hygiene and grooming.          Assessment & Plan:  Routine general medical examination at a health care facility - Plan: POCT Urinalysis Dipstick, CBC with Differential/Platelet, Comprehensive metabolic panel, Lipid panel  Obstructive sleep apnea  Essential hypertension - Plan: CBC with Differential/Platelet, Comprehensive metabolic panel, losartan-hydrochlorothiazide (HYZAAR) 100-12.5 MG per tablet, amLODipine (NORVASC) 5 MG tablet  Allergic rhinitis, mild  Asthma with acute exacerbation, mild persistent  Need for prophylactic vaccination and inoculation against influenza - Plan: Flu Vaccine QUAD 36+ mos PF IM (Fluarix Quad PF)  Elevated PSA  he will continue on his blood pressure medications he will follow-up with his pulmonologist concerning his  allergy and asthma medications. I discussed the elevated PSA especially in regard to prostate infections causing an elevation. Recommended he go very slow in terms of pursuing further workup for prostate cancer.

## 2014-10-16 ENCOUNTER — Ambulatory Visit (INDEPENDENT_AMBULATORY_CARE_PROVIDER_SITE_OTHER): Payer: 59 | Admitting: Family Medicine

## 2014-10-16 ENCOUNTER — Encounter: Payer: Self-pay | Admitting: Family Medicine

## 2014-10-16 ENCOUNTER — Ambulatory Visit
Admission: RE | Admit: 2014-10-16 | Discharge: 2014-10-16 | Disposition: A | Discharge status: Home / Self Care | Payer: 59 | Source: Ambulatory Visit | Attending: Family Medicine | Admitting: Family Medicine

## 2014-10-16 VITALS — BP 122/80 | HR 70 | Wt 197.0 lb

## 2014-10-16 DIAGNOSIS — N529 Male erectile dysfunction, unspecified: Secondary | ICD-10-CM | POA: Diagnosis not present

## 2014-10-16 DIAGNOSIS — M79645 Pain in left finger(s): Secondary | ICD-10-CM

## 2014-10-16 MED ORDER — VARDENAFIL HCL 20 MG PO TABS: 20.0000 mg | ORAL_TABLET | Freq: Every day | ORAL | Status: DC | PRN

## 2014-10-16 NOTE — Progress Notes (Signed)
   Subjective:    Patient ID: Jeffrey Forbes, male    DOB: 08/03/1951, 64 y.o.   MRN: [id number]  HPI He complains of a 2-1/2 week history of left thumb pain. Injury to this area. No other joints are involved. He has tried low-dose anti-inflammatories with minimal benefit. He also complains of difficulty with erectile dysfunction stating at least 75% a time he has difficulty getting and maintaining an erection. Smoking and drinking were reviewed. He is on blood pressure medication.   Review of Systems     Objective:   Physical Exam Alert and in no distress. Exam of the thumb does show slight tenderness to palpation at the base of the thumb but not over the joint. Good motion of the thumb.       Assessment & Plan:  Thumb pain, left - Plan: DG Finger Thumb Left  Erectile dysfunction, unspecified erectile dysfunction type - Plan: vardenafil (LEVITRA) 20 MG tablet  will order an x-ray and have him use 2 Aleve twice per day pending x-ray results. If it is negative he should do this for a couple weeks and if continued difficulty, referral to hand surgeon will be given. Also started him on proper use of Levitra. Explained that I did not think he would need this on a regular basis and discussed the concept of priming the pump to help with his erectile issues. Discussed possible side effects.

## 2014-10-16 NOTE — Patient Instructions (Signed)
2 Aleve twice a day for the next 10 days and if still having difficulty, call me for referral

## 2015-03-29 ENCOUNTER — Ambulatory Visit (INDEPENDENT_AMBULATORY_CARE_PROVIDER_SITE_OTHER): Payer: 59 | Admitting: Family Medicine

## 2015-03-29 ENCOUNTER — Encounter: Payer: Self-pay | Admitting: Family Medicine

## 2015-03-29 VITALS — BP 126/80 | HR 97 | Wt 207.0 lb

## 2015-03-29 DIAGNOSIS — R197 Diarrhea, unspecified: Secondary | ICD-10-CM

## 2015-03-29 DIAGNOSIS — K429 Umbilical hernia without obstruction or gangrene: Secondary | ICD-10-CM

## 2015-03-29 MED ORDER — METRONIDAZOLE 250 MG PO TABS: 250.0000 mg | ORAL_TABLET | Freq: Three times a day (TID) | ORAL | Status: DC

## 2015-03-29 NOTE — Progress Notes (Signed)
   Subjective:    Patient ID: Jeffrey Forbes, male    DOB: October 04, 1950, 64 y.o.   MRN: [id number]  HPI He complains of a one-month history of diarrhea. This started after he did drink well water. The diarrhea has been intermittent over the last month and does respond to Imodium. He's had no fever, chills, blood or pus in his stool. He does notice increased flatus. He also complains of some discomfort in the umbilical area. He does have history of umbilical hernia repair.    Review of Systems     Objective:   Physical Exam Alert and in no distress. Cardiac and lung exam are normal. Abdominal exam shows no masses and some tenderness as well as a palpable mass in the umbilical area.       Assessment & Plan:  Recurrent umbilical hernia - Plan: Ambulatory referral to General Surgery  Diarrhea - Plan: metroNIDAZOLE (FLAGYL) 250 MG tablet His symptoms are potentially consistent with giardiasis. I will treat him appropriately. If no response, further evaluation will needed. We'll also refer back to Gen. Surgery to see if any intervention necessary for the palpable mesh.

## 2015-04-06 ENCOUNTER — Encounter: Payer: Self-pay | Admitting: Family Medicine

## 2015-04-06 DIAGNOSIS — R197 Diarrhea, unspecified: Secondary | ICD-10-CM

## 2015-04-06 MED ORDER — METRONIDAZOLE 250 MG PO TABS: 250.0000 mg | ORAL_TABLET | Freq: Three times a day (TID) | ORAL | Status: DC

## 2015-04-06 NOTE — Telephone Encounter (Signed)
He is still having some loose stools. I will give him another course of Flagyl.

## 2015-04-13 ENCOUNTER — Other Ambulatory Visit: Payer: Self-pay

## 2015-04-13 ENCOUNTER — Encounter: Payer: Self-pay | Admitting: Family Medicine

## 2015-04-13 DIAGNOSIS — R197 Diarrhea, unspecified: Secondary | ICD-10-CM

## 2015-09-22 ENCOUNTER — Other Ambulatory Visit: Payer: Self-pay | Admitting: Family Medicine

## 2017-02-12 ENCOUNTER — Institutional Professional Consult (permissible substitution): Payer: 59 | Admitting: Family Medicine

## 2017-02-13 ENCOUNTER — Institutional Professional Consult (permissible substitution): Payer: 59 | Admitting: Family Medicine

## 2017-02-22 ENCOUNTER — Encounter: Payer: Self-pay | Admitting: Family Medicine

## 2017-02-22 ENCOUNTER — Ambulatory Visit (INDEPENDENT_AMBULATORY_CARE_PROVIDER_SITE_OTHER): Payer: 59 | Admitting: Family Medicine

## 2017-02-22 ENCOUNTER — Other Ambulatory Visit: Payer: Self-pay | Admitting: Family Medicine

## 2017-02-22 VITALS — BP 150/100 | HR 63 | Ht 70.0 in | Wt 207.0 lb

## 2017-02-22 DIAGNOSIS — R5383 Other fatigue: Secondary | ICD-10-CM | POA: Diagnosis not present

## 2017-02-22 DIAGNOSIS — I10 Essential (primary) hypertension: Secondary | ICD-10-CM | POA: Diagnosis not present

## 2017-02-22 DIAGNOSIS — G4733 Obstructive sleep apnea (adult) (pediatric): Secondary | ICD-10-CM | POA: Diagnosis not present

## 2017-02-22 LAB — CBC WITH DIFFERENTIAL/PLATELET
BASOS PCT: 0 %
Basophils Absolute: 0 cells/uL (ref 0–200)
EOS PCT: 3 %
Eosinophils Absolute: 186 cells/uL (ref 15–500)
HCT: 41.8 % (ref 38.5–50.0)
Hemoglobin: 14.2 g/dL (ref 13.2–17.1)
Lymphocytes Relative: 36 %
Lymphs Abs: 2232 cells/uL (ref 850–3900)
MCH: 32.1 pg (ref 27.0–33.0)
MCHC: 34 g/dL (ref 32.0–36.0)
MCV: 94.4 fL (ref 80.0–100.0)
MPV: 10.4 fL (ref 7.5–12.5)
Monocytes Absolute: 434 cells/uL (ref 200–950)
Monocytes Relative: 7 %
NEUTROS ABS: 3348 {cells}/uL (ref 1500–7800)
Neutrophils Relative %: 54 %
Platelets: 172 10*3/uL (ref 140–400)
RBC: 4.43 MIL/uL (ref 4.20–5.80)
RDW: 13.6 % (ref 11.0–15.0)
WBC: 6.2 10*3/uL (ref 4.0–10.5)

## 2017-02-22 LAB — COMPREHENSIVE METABOLIC PANEL
ALBUMIN: 4.3 g/dL (ref 3.6–5.1)
ALT: 26 U/L (ref 9–46)
AST: 21 U/L (ref 10–35)
Alkaline Phosphatase: 74 U/L (ref 40–115)
BILIRUBIN TOTAL: 0.5 mg/dL (ref 0.2–1.2)
BUN: 16 mg/dL (ref 7–25)
CO2: 27 mmol/L (ref 20–31)
CREATININE: 0.88 mg/dL (ref 0.70–1.25)
Calcium: 8.7 mg/dL (ref 8.6–10.3)
Chloride: 104 mmol/L (ref 98–110)
Glucose, Bld: 109 mg/dL — ABNORMAL HIGH (ref 65–99)
Potassium: 3.2 mmol/L — ABNORMAL LOW (ref 3.5–5.3)
SODIUM: 140 mmol/L (ref 135–146)
TOTAL PROTEIN: 6.5 g/dL (ref 6.1–8.1)

## 2017-02-22 LAB — LIPID PANEL
Cholesterol: 187 mg/dL (ref <200)
HDL: 32 mg/dL — ABNORMAL LOW (ref >40)
Total CHOL/HDL Ratio: 5.8 Ratio — ABNORMAL HIGH (ref <5.0)
Triglycerides: 419 mg/dL — ABNORMAL HIGH (ref <150)

## 2017-02-22 MED ORDER — LOSARTAN POTASSIUM-HCTZ 100-12.5 MG PO TABS: 1.0000 | ORAL_TABLET | Freq: Every day | ORAL | 3 refills | Status: DC

## 2017-02-22 MED ORDER — AMLODIPINE BESYLATE 5 MG PO TABS: ORAL_TABLET | ORAL | 3 refills | Status: DC

## 2017-02-22 NOTE — Progress Notes (Signed)
   Subjective:    Patient ID: Jeffrey Forbes, male    DOB: 1950-10-16, 66 y.o.   MRN: [id number]  HPI He is here for an interval evaluation. He has been getting care in other facilities over the last several years. For economic and insurance reasons he stopped taking his blood pressure medication and now would like to place back on it. He also has underlying OSA and in the last 6 months has noted increased fatigue, falling asleep easily and having no symptoms similar to when he was placed on his CPAP. He has not had a readout well over a year. Has a previous history of asthma but presently is on no medications. He does have a lesion on the left medial thigh that did get red and swollen approximate several days ago but now is having no trouble.   Review of Systems     Objective:   Physical Exam Alert and in no distress. Exam of the left medial superior knee area does show a healing lesion probably representing a healing folliculitis       Assessment & Plan:  Obstructive sleep apnea  Essential hypertension - Plan: losartan-hydrochlorothiazide (HYZAAR) 100-12.5 MG tablet, amLODipine (NORVASC) 5 MG tablet  Fatigue, unspecified type I will place him back on his blood pressure medications and have him follow-up here in one month concerning that. He is also to get his CPAP readout and have been sending me the results. I will do routine blood screening on him

## 2017-02-27 LAB — HEMOGLOBIN A1C
HEMOGLOBIN A1C: 4.5 % (ref <5.7)
Mean Plasma Glucose: 82 mg/dL

## 2017-03-14 ENCOUNTER — Encounter: Payer: Self-pay | Admitting: Internal Medicine

## 2017-03-22 ENCOUNTER — Encounter: Payer: Self-pay | Admitting: Family Medicine

## 2017-03-22 ENCOUNTER — Ambulatory Visit (INDEPENDENT_AMBULATORY_CARE_PROVIDER_SITE_OTHER): Payer: 59 | Admitting: Family Medicine

## 2017-03-22 VITALS — BP 124/82 | HR 64 | Wt 206.0 lb

## 2017-03-22 DIAGNOSIS — G4733 Obstructive sleep apnea (adult) (pediatric): Secondary | ICD-10-CM

## 2017-03-22 DIAGNOSIS — I10 Essential (primary) hypertension: Secondary | ICD-10-CM | POA: Diagnosis not present

## 2017-03-22 MED ORDER — DILTIAZEM HCL ER 60 MG PO CP12: 60.0000 mg | ORAL_CAPSULE | Freq: Two times a day (BID) | ORAL | 1 refills | Status: DC

## 2017-03-22 NOTE — Progress Notes (Signed)
   Subjective:    Patient ID: Jeffrey Forbes, male    DOB: [birth year], 66 y.o.   MRN: [id number]  HPI He is here for recheck. He is now on amlodipine but has noted unacceptable swelling in his feet especially on the left where he has had previous difficulty. He would like to be switched to different medicine. He also took his CPAP card for read-out however review of my record indicates I do not have it.   Review of Systems     Objective:   Physical Exam        Assessment & Plan:  Essential hypertension - Plan: diltiazem (CARDIZEM SR) 60 MG 12 hr capsule  OSA (obstructive sleep apnea) We will call to get a readout and respond when it comes in. I will switch him to Cardizem and have him recheck with [place] in one month.

## 2017-05-20 ENCOUNTER — Other Ambulatory Visit: Payer: Self-pay | Admitting: Family Medicine

## 2017-05-20 DIAGNOSIS — I10 Essential (primary) hypertension: Secondary | ICD-10-CM

## 2017-05-28 ENCOUNTER — Ambulatory Visit (INDEPENDENT_AMBULATORY_CARE_PROVIDER_SITE_OTHER): Payer: 59 | Admitting: Family Medicine

## 2017-05-28 ENCOUNTER — Encounter: Payer: Self-pay | Admitting: Family Medicine

## 2017-05-28 VITALS — BP 132/82 | HR 64 | Ht 70.0 in | Wt 204.0 lb

## 2017-05-28 DIAGNOSIS — R197 Diarrhea, unspecified: Secondary | ICD-10-CM | POA: Diagnosis not present

## 2017-05-28 DIAGNOSIS — Z23 Encounter for immunization: Secondary | ICD-10-CM | POA: Diagnosis not present

## 2017-05-28 DIAGNOSIS — I10 Essential (primary) hypertension: Secondary | ICD-10-CM

## 2017-05-28 MED ORDER — METRONIDAZOLE 500 MG PO TABS: 500.0000 mg | ORAL_TABLET | Freq: Three times a day (TID) | ORAL | 0 refills | Status: DC

## 2017-05-28 NOTE — Progress Notes (Signed)
   Subjective:    Patient ID: Salil Raineri, male    DOB: [birth year], 66 y.o.   MRN: [id number]  HPI He complains of a 3 month history of loose bowel movements. He is having 3 or 4 per day. Normally he has 1 or 2. He has had no travel outside the country drinking from well water or swimming in lakes. He did have an advice given to him in January of this year for treatment of sinus infection. No fever, chills, blood or pus in his stool.    Review of Systems     Objective:   Physical Exam Alert and in no distress. Tympanic membranes and canals are normal. Pharyngeal area is normal. Neck is supple without adenopathy or thyromegaly. Cardiac exam shows a regular sinus rhythm without murmurs or gallops. Lungs are clear to auscultation.Abdominal exam shows no masses or tenderness. Blood pressure was rechecked.        Assessment & Plan:  Diarrhea, unspecified type - Plan: metroNIDAZOLE (FLAGYL) 500 MG tablet  Need for influenza vaccination - Plan: Flu vaccine HIGH DOSE PF (Fluzone High dose)  Need for shingles vaccine - Plan: Varicella-zoster vaccine IM (Shingrix)  Essential hypertension I explained that at this point the diarrhea could possibly be C. difficile or some other infectious etiology and think it's reasonable to treat him. Discussed possibly using a different medicine that this doesn't work and then potentially referring. He is comfortable with this.

## 2017-06-11 ENCOUNTER — Telehealth: Payer: Self-pay | Admitting: Family Medicine

## 2017-06-11 DIAGNOSIS — I10 Essential (primary) hypertension: Secondary | ICD-10-CM

## 2017-06-11 MED ORDER — DILTIAZEM HCL ER 60 MG PO CP12: 60.0000 mg | ORAL_CAPSULE | Freq: Two times a day (BID) | ORAL | 1 refills | Status: DC

## 2017-06-11 NOTE — Telephone Encounter (Signed)
Called and advised pt that we need to send med to another pharmacy. [facility] [address] [facility] [place] and if that came back on back order to send to [facility]

## 2017-06-11 NOTE — Telephone Encounter (Signed)
[facility] Dr Redmond School that Diltiazem 60 mg is on backorder/unavailable

## 2017-06-11 NOTE — Telephone Encounter (Signed)
Call and have him work on getting it filled at a different drugstore.

## 2017-06-12 ENCOUNTER — Encounter: Payer: Self-pay | Admitting: Family Medicine

## 2017-06-12 ENCOUNTER — Ambulatory Visit (INDEPENDENT_AMBULATORY_CARE_PROVIDER_SITE_OTHER): Payer: 59 | Admitting: Family Medicine

## 2017-06-12 VITALS — BP 130/80 | HR 61 | Temp 98.1°F | Wt 205.0 lb

## 2017-06-12 DIAGNOSIS — Z0000 Encounter for general adult medical examination without abnormal findings: Secondary | ICD-10-CM

## 2017-06-12 DIAGNOSIS — Z23 Encounter for immunization: Secondary | ICD-10-CM

## 2017-06-12 NOTE — Progress Notes (Signed)
   Subjective:    Patient ID: Jeffrey Forbes, male    DOB: Sep 17, 1950, 66 y.o.   MRN: [id number]  HPI He is here for consult concerning seeing green stool today. No nausea, vomiting, abdominal pain, weight change.   Review of Systems     Objective:   Physical Exam Alert and in no distress. Rectal exam does show greenish stool that is guaiac negative.       Assessment & Plan:  Need for vaccination against Streptococcus pneumoniae - Plan: Pneumococcal conjugate vaccine 13-valent  Normal physical exam  Upon further questioning after seeing the green stool he did realize that he did have a cupcake with green icing on it.

## 2017-06-14 ENCOUNTER — Telehealth: Payer: Self-pay | Admitting: Family Medicine

## 2017-06-14 MED ORDER — TERAZOSIN HCL 1 MG PO CAPS: 1.0000 mg | ORAL_CAPSULE | Freq: Every day | ORAL | 1 refills | Status: DC

## 2017-06-14 NOTE — Telephone Encounter (Signed)
The Diltiazem cannot be found at any pharmacy.  Please consider giving him an alternative.  Pt ph [phone]

## 2017-06-14 NOTE — Telephone Encounter (Signed)
Diltiazem is apparently out of stock. I will switch him to Hytrin and recommend he come back here in one month.

## 2017-06-14 NOTE — Telephone Encounter (Signed)
Let him know that I called in an entirely different medicine and he will need to be rechecked here in one month.

## 2017-06-14 NOTE — Telephone Encounter (Signed)
Left message for patient

## 2017-08-02 ENCOUNTER — Encounter: Payer: Self-pay | Admitting: Family Medicine

## 2017-08-02 ENCOUNTER — Ambulatory Visit (INDEPENDENT_AMBULATORY_CARE_PROVIDER_SITE_OTHER): Payer: 59 | Admitting: Family Medicine

## 2017-08-02 VITALS — BP 150/80 | HR 60 | Resp 16 | Wt 205.4 lb

## 2017-08-02 DIAGNOSIS — Z23 Encounter for immunization: Secondary | ICD-10-CM | POA: Diagnosis not present

## 2017-08-02 DIAGNOSIS — G4733 Obstructive sleep apnea (adult) (pediatric): Secondary | ICD-10-CM | POA: Diagnosis not present

## 2017-08-02 DIAGNOSIS — J309 Allergic rhinitis, unspecified: Secondary | ICD-10-CM | POA: Diagnosis not present

## 2017-08-02 DIAGNOSIS — I10 Essential (primary) hypertension: Secondary | ICD-10-CM

## 2017-08-02 DIAGNOSIS — R5383 Other fatigue: Secondary | ICD-10-CM

## 2017-08-02 NOTE — Progress Notes (Signed)
   Subjective:    Patient ID: Jeffrey Forbes, male    DOB: Mar 15, 1951, 67 y.o.   MRN: [id number]  HPI He is here for consult concerning 55-month history of increasing difficulty with fatigue, excessive drowsiness.  He is on one occasion falling asleep while sitting in traffic.  He also describes another incident of being in the bathroom and the next thing was him being asleep on the floor in the bathroom.  He does have underlying allergies which she says are under good control with Zyrtec and Nasonex.  He also has a history of OSA and is on CPAP but has not had a read out recently.  Continues on his blood pressure medications.   Review of Systems     Objective:   Physical Exam Alert and in no distress otherwise not examined       Assessment & Plan:  Fatigue, unspecified type - Plan: CBC with Differential/Platelet, Comprehensive metabolic panel, Testosterone, TSH  OSA (obstructive sleep apnea)  Essential hypertension - Plan: CBC with Differential/Platelet, Comprehensive metabolic panel  Allergic rhinitis, mild  Need for shingles vaccine - Plan: Varicella-zoster vaccine IM (Shingrix) He is to get a read out on his machine and let me know what this is.  I would do some routine blood screening on him.  We will also hold off on changing his blood pressure medication until after we make sure his CPAP is working correctly.

## 2017-08-03 LAB — COMPREHENSIVE METABOLIC PANEL WITH GFR
AG Ratio: 2 (calc) (ref 1.0–2.5)
ALT: 23 U/L (ref 9–46)
AST: 20 U/L (ref 10–35)
Albumin: 4.4 g/dL (ref 3.6–5.1)
Alkaline phosphatase (APISO): 64 U/L (ref 40–115)
BUN: 15 mg/dL (ref 7–25)
CO2: 29 mmol/L (ref 20–32)
Calcium: 9.1 mg/dL (ref 8.6–10.3)
Chloride: 105 mmol/L (ref 98–110)
Creat: 0.98 mg/dL (ref 0.70–1.25)
Globulin: 2.2 g/dL (ref 1.9–3.7)
Glucose, Bld: 100 mg/dL — ABNORMAL HIGH (ref 65–99)
Potassium: 3.3 mmol/L — ABNORMAL LOW (ref 3.5–5.3)
Sodium: 142 mmol/L (ref 135–146)
Total Bilirubin: 1 mg/dL (ref 0.2–1.2)
Total Protein: 6.6 g/dL (ref 6.1–8.1)

## 2017-08-03 LAB — CBC WITH DIFFERENTIAL/PLATELET
Basophils Absolute: 41 cells/uL (ref 0–200)
Basophils Relative: 0.8 %
EOS PCT: 4.3 %
Eosinophils Absolute: 219 cells/uL (ref 15–500)
HEMATOCRIT: 40.2 % (ref 38.5–50.0)
Hemoglobin: 14.1 g/dL (ref 13.2–17.1)
LYMPHS ABS: 1601 {cells}/uL (ref 850–3900)
MCH: 31.6 pg (ref 27.0–33.0)
MCHC: 35.1 g/dL (ref 32.0–36.0)
MCV: 90.1 fL (ref 80.0–100.0)
MONOS PCT: 7.9 %
MPV: 11 fL (ref 7.5–12.5)
NEUTROS PCT: 55.6 %
Neutro Abs: 2836 cells/uL (ref 1500–7800)
PLATELETS: 155 10*3/uL (ref 140–400)
RBC: 4.46 10*6/uL (ref 4.20–5.80)
RDW: 12.4 % (ref 11.0–15.0)
TOTAL LYMPHOCYTE: 31.4 %
WBC mixed population: 403 cells/uL (ref 200–950)
WBC: 5.1 10*3/uL (ref 3.8–10.8)

## 2017-08-03 LAB — TESTOSTERONE: Testosterone: 600 ng/dL (ref 250–827)

## 2017-08-03 LAB — TSH: TSH: 1.01 mIU/L (ref 0.40–4.50)

## 2017-08-12 ENCOUNTER — Other Ambulatory Visit: Payer: Self-pay | Admitting: Family Medicine

## 2017-08-16 ENCOUNTER — Telehealth: Payer: Self-pay | Admitting: Family Medicine

## 2017-08-16 NOTE — Telephone Encounter (Signed)
Since his CPAP is working and his blood work is negative, recommend he come in and I will examine him again and see if something else shows up.

## 2017-08-16 NOTE — Telephone Encounter (Signed)
Spoke with pt- he states he is not able to wait until Monday. I put him on the schedule for Friday. Victorino December

## 2017-08-16 NOTE — Telephone Encounter (Signed)
Pt said he had his CPAP machine evaluated and he was told that his machine was fine. Pt will be waiting for information from Dr Redmond School on what to do now.

## 2017-08-17 ENCOUNTER — Ambulatory Visit (INDEPENDENT_AMBULATORY_CARE_PROVIDER_SITE_OTHER): Payer: 59 | Admitting: Family Medicine

## 2017-08-17 ENCOUNTER — Encounter: Payer: Self-pay | Admitting: Family Medicine

## 2017-08-17 VITALS — BP 132/82 | HR 66 | Ht 70.5 in | Wt 206.0 lb

## 2017-08-17 DIAGNOSIS — R5383 Other fatigue: Secondary | ICD-10-CM

## 2017-08-17 NOTE — Progress Notes (Signed)
   Subjective:    Patient ID: Jeffrey Forbes, male    DOB: September 08, 1950, 67 y.o.   MRN: [id number]  HPI He is here for a recheck.  He called stating that his machine was fine and I interpreted that is indicating that his CPAP read out was negative however we do not have that report yet so further evaluation and intervention is really not possible.   Review of Systems     Objective:   Physical Exam Alert and in no distress otherwise not examined       Assessment & Plan:  Fatigue, unspecified type We will get the read out and proceed on from there.

## 2017-08-24 ENCOUNTER — Telehealth: Payer: Self-pay | Admitting: Family Medicine

## 2017-08-24 ENCOUNTER — Other Ambulatory Visit: Payer: Self-pay

## 2017-08-24 DIAGNOSIS — G4733 Obstructive sleep apnea (adult) (pediatric): Secondary | ICD-10-CM

## 2017-08-24 NOTE — Telephone Encounter (Signed)
Pt called and stated he was told his labs were ok but he is still having issues with tiredness. He would like to know what the next step is. Please call pt [phone].

## 2017-08-24 NOTE — Telephone Encounter (Signed)
Sent referral to the   [facility] for sleep

## 2017-08-24 NOTE — Telephone Encounter (Signed)
He is supposed to be scheduled to see a sleep specialist.  If that has not been done, get him set up.  I did discuss this with him.

## 2017-08-27 ENCOUNTER — Ambulatory Visit: Payer: POS | Admitting: Family Medicine

## 2017-09-07 ENCOUNTER — Encounter: Payer: Self-pay | Admitting: Family Medicine

## 2017-09-07 DIAGNOSIS — G4733 Obstructive sleep apnea (adult) (pediatric): Secondary | ICD-10-CM

## 2017-09-28 ENCOUNTER — Ambulatory Visit (HOSPITAL_BASED_OUTPATIENT_CLINIC_OR_DEPARTMENT_OTHER): Payer: 59 | Attending: Family Medicine | Admitting: Internal Medicine

## 2017-09-28 VITALS — Ht 70.0 in | Wt 205.0 lb

## 2017-09-28 DIAGNOSIS — G4736 Sleep related hypoventilation in conditions classified elsewhere: Secondary | ICD-10-CM | POA: Diagnosis not present

## 2017-09-28 DIAGNOSIS — G4733 Obstructive sleep apnea (adult) (pediatric): Secondary | ICD-10-CM | POA: Insufficient documentation

## 2017-10-08 ENCOUNTER — Ambulatory Visit (INDEPENDENT_AMBULATORY_CARE_PROVIDER_SITE_OTHER): Payer: 59 | Admitting: Family Medicine

## 2017-10-08 ENCOUNTER — Encounter: Payer: Self-pay | Admitting: Family Medicine

## 2017-10-08 VITALS — BP 140/88 | HR 62 | Wt 208.2 lb

## 2017-10-08 DIAGNOSIS — G4733 Obstructive sleep apnea (adult) (pediatric): Secondary | ICD-10-CM

## 2017-10-08 DIAGNOSIS — L03012 Cellulitis of left finger: Secondary | ICD-10-CM

## 2017-10-08 NOTE — Progress Notes (Signed)
   Subjective:    Patient ID: Jeffrey Forbes, male    DOB: August 23, 1950, 67 y.o.   MRN: [id number]  HPI He was seen in an urgent care center about 10 days ago and treated with an antibiotic for a skin infection.  He was seen again 2 days later and an I+D was performed .  He was seen in the emergency room last Wednesday and another I+D was performed.  He was then placed on clindamycin which did help his infection.  They stated that they wanted to cover for MRSA. He also recently had a sleep study done however the results are not in the chart yet.  Review of Systems     Objective:   Physical Exam Alert and in no distress exam of the left index finger does show redness and slight swelling at the cuticle.  No drainage is noted.      Assessment & Plan:  Obstructive sleep apnea  Paronychia of finger of left hand  I explained that he seems to be healing nicely from the infection after they placed him on clindamycin.  Instructed him to take the rest of the medication. I will call him when I get the sleep study back.

## 2017-10-11 ENCOUNTER — Other Ambulatory Visit: Payer: Self-pay | Admitting: Family Medicine

## 2017-10-11 NOTE — Telephone Encounter (Signed)
Please advise if med hytrin [facility] be [facility] [place] . Thanks [employer]

## 2017-10-12 ENCOUNTER — Encounter: Payer: Self-pay | Admitting: Medical

## 2017-10-12 ENCOUNTER — Ambulatory Visit (INDEPENDENT_AMBULATORY_CARE_PROVIDER_SITE_OTHER): Payer: 59 | Admitting: Medical

## 2017-10-12 VITALS — BP 136/80 | HR 61 | Wt 209.4 lb

## 2017-10-12 DIAGNOSIS — G4733 Obstructive sleep apnea (adult) (pediatric): Secondary | ICD-10-CM | POA: Diagnosis not present

## 2017-10-12 DIAGNOSIS — L03012 Cellulitis of left finger: Secondary | ICD-10-CM

## 2017-10-12 MED ORDER — CIPROFLOXACIN HCL 500 MG PO TABS: 500.0000 mg | ORAL_TABLET | Freq: Two times a day (BID) | ORAL | 0 refills | Status: DC

## 2017-10-12 MED ORDER — CLINDAMYCIN HCL 300 MG PO CAPS: 300.0000 mg | ORAL_CAPSULE | Freq: Three times a day (TID) | ORAL | 0 refills | Status: DC

## 2017-10-12 NOTE — Progress Notes (Signed)
Subjective: Chief Complaint  Patient presents with  . infection in finger    in middle finger on left side    Here for finger infection.   He started having problems with left middle finger 2.5 weeks ago.   He initially went to the emergency department for this, had paronychia procedure, put on Keflex antibiotic.  A lot of pus came out at that visit.  Went back to the emergency department a few days later for repeat paronychia procedure and even more pus was expressed.  Put on Cipro at that time.  After about a week of being on Keflex and Cipro he still had considerable redness and pain and swelling, had another visit at urgent care and a third paronychia procedure I&D.  Was changed to clindamycin at that point.  Overall is much improved but still not seeing the improvement he wishes.  Not in terrible pain less redness now but still not healed up.  No other aggravating or relieving factors. No other complaint.  Past Medical History:  Diagnosis Date  . Allergic rhinitis   . Hemorrhoids   . Hypertension   . Personal history of colonic polyps   . Sleep apnea    Current Outpatient Medications on File Prior to Visit  Medication Sig Dispense Refill  . aspirin 81 MG tablet Take 160 mg by mouth daily.    . cetirizine (ZYRTEC) 10 MG tablet Take 10 mg by mouth daily.    . fish oil-omega-3 fatty acids 1000 MG capsule Take 2 g by mouth daily.    Marland Kitchen losartan-hydrochlorothiazide (HYZAAR) 100-12.5 MG tablet Take 1 tablet by mouth daily. 90 tablet 3  . Multiple Vitamins-Minerals (MULTIVITAMIN WITH MINERALS) tablet Take 1 tablet by mouth daily.    . potassium chloride (MICRO-K) 10 MEQ CR capsule Take 10 mEq by mouth 2 (two) times daily.    . tamsulosin (FLOMAX) 0.4 MG CAPS capsule Take 0.4 mg by mouth.    . terazosin (HYTRIN) 1 MG capsule TAKE 1 CAPSULE (1 MG TOTAL) BY MOUTH AT BEDTIME. 90 capsule 3  . mometasone (NASONEX) 50 MCG/ACT nasal spray Place 2 sprays into the nose daily. (Patient not taking:  Reported on 10/12/2017) 17 g 12  . traMADol (ULTRAM) 50 MG tablet Take 50 mg by mouth every 6 (six) hours as needed.     No current facility-administered medications on file prior to visit.    ROS as in subjective   Objective: BP 136/80   Pulse 61   Wt 209 lb 6.4 oz (95 kg)   SpO2 96%   BMI 30.05 kg/m   General: Well-developed well-nourished no acute distress Left third finger distal phalanx with pink tissue around the nail bed with slight fluctuance but no warmth no redness as it was prior, but there is a small pus pocket in the proximal lateral nail bed Only slightly tender with pressure over the nailbeds Otherwise fingers neurovascularly intact     Assessment: Encounter Diagnosis  Name Primary?  . Paronychia of finger of left hand Yes     Plan We discussed his symptoms and concerns.  Discussed treatment thus far.  I called and spoke to Dr. Derrel Nip about his case to get some advice.   With his consent I cleaned and prepped his left third finger soft tissue along the nailbed, used ethyl acetate spray for local anesthesia, and used a #11 blade to incise the small area of fluctuance but only blood was expressed no pus.  Discussed wound care and  put a clean bandage over the finger.  At this point advised ice water bath to reduce inflammation for 20 minutes, then let the finger warm to room temperature, then do salty soapy hot water soak for 20 minutes.  Do this regimen twice daily for the next 2-3 days.  We will go just a little bit more time on antibiotic as below for the next 3-5 days.  Advised he call back Monday in 3 days to give [place] an update on his symptoms.  If much worse over the weekend then get reevaluated.  Jeffrey Forbes was seen today for infection in finger.  Diagnoses and all orders for this visit:  Paronychia of finger of left hand  Other orders -     clindamycin (CLEOCIN) 300 MG capsule; Take 1 capsule (300 mg total) by mouth 3 (three) times daily. -      ciprofloxacin (CIPRO) 500 MG tablet; Take 1 tablet (500 mg total) by mouth 2 (two) times daily.

## 2017-10-12 NOTE — Procedures (Signed)
   Patient Name: Jeffrey Forbes, [place] Date: 09/28/2017 Gender: Male D.O.B: [birth year] Age (years): 39 Referring Provider: Denita Lung Height (inches): 29 Interpreting Physician: Baird Lyons MD, ABSM Weight (lbs): 205 RPSGT: Jacolyn Reedy BMI: 29 MRN: [id number] Neck Size: 18.00 <br> <br> CLINICAL INFORMATION Sleep Study Type: HST  Indication for sleep study: OSA  Epworth Sleepiness Score: 19  SLEEP STUDY TECHNIQUE A multi-channel overnight portable sleep study was performed. The channels recorded were: nasal airflow, thoracic respiratory movement, and oxygen saturation with a pulse oximetry. Snoring was also monitored.  MEDICATIONS Patient self administered medications include: none reported.  SLEEP ARCHITECTURE Patient was studied for 453.5 minutes. The sleep efficiency was 96.9 % and the patient was supine for 30.5%. The arousal index was 0.0 per hour.  RESPIRATORY PARAMETERS The overall AHI was 13.2 per hour, with a central apnea index of 0.0 per hour.  The oxygen nadir was 78% during sleep.  CARDIAC DATA Mean heart rate during sleep was 50.8 bpm.  IMPRESSIONS - Mild obstructive sleep apnea occurred during this study (AHI = 13.2/h). - No significant central sleep apnea occurred during this study (CAI = 0.0/h). - Oxygen desaturation was noted during this study (Min O2 = 78%). Mean 90%. Time with sat - Patient snored.  DIAGNOSIS - Obstructive Sleep Apnea (327.23 [G47.33 ICD-10]) - Nocturnal Hypoxemia (327.26 [G47.36 ICD-10])  RECOMMENDATIONS - Recommend CPAP titration study, whyich would also assess whether CPAP is sufficient to correct hypoxemia. - Avoid alcohol, sedatives and other CNS depressants that may worsen sleep apnea and disrupt normal sleep architecture. - Sleep hygiene should be reviewed to assess factors that may improve sleep quality. - Weight management and regular exercise should be initiated or continued.  [Electronically signed]  10/12/2017 07:37 PM  Baird Lyons MD, [place], American Board of Sleep Medicine   NPI: [id number]                          [place], [facility]  ELECTRONICALLY SIGNED ON:  10/12/2017, 7:28 PM [place] PH: [phone]   FX: [phone] [place]

## 2017-10-17 ENCOUNTER — Telehealth: Payer: Self-pay

## 2017-10-17 NOTE — Telephone Encounter (Signed)
Called pt to ask c-PAP pressure on his machine. No answer left message to have pt to call back t the office. Thanks [employer]

## 2017-11-23 ENCOUNTER — Encounter: Payer: Self-pay | Admitting: Medical

## 2017-11-23 ENCOUNTER — Ambulatory Visit (INDEPENDENT_AMBULATORY_CARE_PROVIDER_SITE_OTHER): Payer: 59 | Admitting: Medical

## 2017-11-23 VITALS — BP 130/84 | HR 86 | Ht 70.25 in | Wt 207.0 lb

## 2017-11-23 DIAGNOSIS — L608 Other nail disorders: Secondary | ICD-10-CM | POA: Diagnosis not present

## 2017-11-23 DIAGNOSIS — L03012 Cellulitis of left finger: Secondary | ICD-10-CM

## 2017-11-23 MED ORDER — MUPIROCIN 2 % EX OINT: 1.0000 "application " | TOPICAL_OINTMENT | Freq: Three times a day (TID) | CUTANEOUS | 0 refills | Status: DC

## 2017-11-23 MED ORDER — DOXYCYCLINE HYCLATE 100 MG PO TABS: 100.0000 mg | ORAL_TABLET | Freq: Two times a day (BID) | ORAL | 0 refills | Status: DC

## 2017-11-23 NOTE — Progress Notes (Signed)
Subjective: Chief Complaint  Patient presents with  . Finger Pain    treated as MRSA previously in Feb, nail fell off and a piece stayed atached, he tried to remove it and now it is infected again. ring middle finger   Here for recurrence of finger infection.    I saw him in 10/12/17 after having weeks of dealing with paronychia.  After last visit here the clindamycin finally cleared the infection but the nail ended up coming off except for one portion.   He a stalk of nail tissue that was poking up snagging on things.  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  He ended up using nail clippers to remove the small stalk.  In recent days getting red and tender nail bed again.  Worried about recurrent infection.    He had been on 3 different antibiotics and had several rounds of I&D  in February and March for left middle finger paronychia that took a long time to clear up.   No other aggravating or relieving factors. No other complaint.  Past Medical History:  Diagnosis Date  . Allergic rhinitis   . Hemorrhoids   . Hypertension   . Personal history of colonic polyps   . Sleep apnea    Current Outpatient Medications on File Prior to Visit  Medication Sig Dispense Refill  . aspirin 81 MG tablet Take 160 mg by mouth daily.    . cetirizine (ZYRTEC) 10 MG tablet Take 10 mg by mouth daily.    . fish oil-omega-3 fatty acids 1000 MG capsule Take 2 g by mouth daily.    Marland Kitchen losartan-hydrochlorothiazide (HYZAAR) 100-12.5 MG tablet Take 1 tablet by mouth daily. 90 tablet 3  . mometasone (NASONEX) 50 MCG/ACT nasal spray Place 2 sprays into the nose daily. 17 g 12  . Multiple Vitamins-Minerals (MULTIVITAMIN WITH MINERALS) tablet Take 1 tablet by mouth daily.    . tamsulosin (FLOMAX) 0.4 MG CAPS capsule Take 0.4 mg by mouth.    . terazosin (HYTRIN) 1 MG capsule TAKE 1 CAPSULE (1 MG TOTAL) BY MOUTH AT BEDTIME. 90 capsule 3  . ciprofloxacin (CIPRO) 500 MG tablet Take 1 tablet (500 mg total) by mouth 2  (two) times daily. (Patient not taking: Reported on 11/23/2017) 10 tablet 0  . clindamycin (CLEOCIN) 300 MG capsule Take 1 capsule (300 mg total) by mouth 3 (three) times daily. (Patient not taking: Reported on 11/23/2017) 15 capsule 0  . potassium chloride (MICRO-K) 10 MEQ CR capsule Take 10 mEq by mouth 2 (two) times daily.    . traMADol (ULTRAM) 50 MG tablet Take 50 mg by mouth every 6 (six) hours as needed.     No current facility-administered medications on file prior to visit.    ROS as in subjective   Objective: BP 130/84 (BP Location: Right Arm, Patient Position: Sitting, Cuff Size: Normal)   Pulse 86   Ht 5' 10.25" (1.784 m)   Wt 207 lb (93.9 kg)   SpO2 95%   BMI 29.49 kg/m   General: Well-developed well-nourished no acute distress Left third finger distal phalanx with pink tissue around the nail bed without fluctuance but no warmth no redness as it was prior, Only slightly tender with pressure over the nailbeds Only 1/3 of nail present, rest has fallen off prior Otherwise fingers neurovascularly intact     Assessment: Encounter Diagnoses  Name Primary?  . Paronychia of finger, left Yes  . Nail deformity  Plan Treatment below including soapy hot water finger soaks BID for 3-5 days, medications below.    F/u with Dr. Redmond School PCP if not resolving within 2 weeks.  Nail will hopefully grow back.    Jarryn was seen today for finger pain.  Diagnoses and all orders for this visit:  Paronychia of finger, left  Nail deformity  Other orders -     doxycycline (VIBRA-TABS) 100 MG tablet; Take 1 tablet (100 mg total) by mouth 2 (two) times daily. -     mupirocin ointment (BACTROBAN) 2 %; Place 1 application into the nose 3 (three) times daily.

## 2017-11-26 ENCOUNTER — Telehealth: Payer: Self-pay | Admitting: Medical

## 2017-11-26 NOTE — Telephone Encounter (Signed)
Please call Mr. Rafter back.   I spoke to Dr. Redmond School about the nail.   He said that its somewhat common to lose a nail with infection like he has had but often the nail will grow back.     So lets c/t same plan with current antibiotics, keep the finger clean with soap and water, and use the ointment I prescribed as well topically.

## 2017-11-26 NOTE — Telephone Encounter (Signed)
Patient has been informed of providers note. Patient was also informed that the ointment should go on his finger. Patient understood and agreed with the plan.

## 2018-02-16 ENCOUNTER — Other Ambulatory Visit: Payer: Self-pay | Admitting: Family Medicine

## 2018-02-16 DIAGNOSIS — I10 Essential (primary) hypertension: Secondary | ICD-10-CM

## 2018-02-18 ENCOUNTER — Encounter: Payer: Self-pay | Admitting: Family Medicine

## 2018-02-18 ENCOUNTER — Ambulatory Visit (INDEPENDENT_AMBULATORY_CARE_PROVIDER_SITE_OTHER): Payer: 59 | Admitting: Family Medicine

## 2018-02-18 VITALS — BP 138/82 | HR 85 | Temp 97.6°F | Wt 207.8 lb

## 2018-02-18 DIAGNOSIS — Z1159 Encounter for screening for other viral diseases: Secondary | ICD-10-CM

## 2018-02-18 DIAGNOSIS — J309 Allergic rhinitis, unspecified: Secondary | ICD-10-CM | POA: Diagnosis not present

## 2018-02-18 DIAGNOSIS — D126 Benign neoplasm of colon, unspecified: Secondary | ICD-10-CM

## 2018-02-18 DIAGNOSIS — G4733 Obstructive sleep apnea (adult) (pediatric): Secondary | ICD-10-CM | POA: Diagnosis not present

## 2018-02-18 DIAGNOSIS — I10 Essential (primary) hypertension: Secondary | ICD-10-CM | POA: Diagnosis not present

## 2018-02-18 DIAGNOSIS — Z13220 Encounter for screening for lipoid disorders: Secondary | ICD-10-CM

## 2018-02-18 MED ORDER — METOPROLOL SUCCINATE ER 25 MG PO TB24: 25.0000 mg | ORAL_TABLET | Freq: Every day | ORAL | 1 refills | Status: DC

## 2018-02-18 NOTE — Progress Notes (Signed)
   Subjective:    Patient ID: Jeffrey Forbes, male    DOB: Dec 06, 1950, 67 y.o.   MRN: [id number]  HPI He is here for a med check appointment.  He is being seen by urologist who has him on Flomax as well as finasteride.  He was also placed on Hytrin by me but only on the 1 mg dosing for his blood pressure.  His allergies are under good control.  He continues to use CPAP for his OSA.  He has not had a read out in about a year.  He is scheduled for colonoscopy sometime this year.  He also scheduled for knee surgery later in July.  He has had no chest pain, shortness of breath or pulmonary symptoms.   Review of Systems     Objective:   Physical Exam Alert and in no distress. Tympanic membranes and canals are normal. Pharyngeal area is normal. Neck is supple without adenopathy or thyromegaly. Cardiac exam shows a regular sinus rhythm without murmurs or gallops. Lungs are clear to auscultation.        Assessment & Plan:  Allergic rhinitis, mild  Essential hypertension - Plan: metoprolol succinate (TOPROL-XL) 25 MG 24 hr tablet  Obstructive sleep apnea  Adenomatous polyp of colon, unspecified part of colon  Encounter for hepatitis C screening test for low risk patient - Plan: Hepatitis C antibody  Screening for lipid disorders - Plan: Lipid panel I will switch him to Toprol and have him return here in 1 month for recheck on that.  I will clear him for surgery

## 2018-02-19 LAB — LIPID PANEL
CHOLESTEROL TOTAL: 185 mg/dL (ref 100–199)
Chol/HDL Ratio: 4.7 ratio (ref 0.0–5.0)
HDL: 39 mg/dL — ABNORMAL LOW (ref >39)
LDL CALC: 102 mg/dL — AB (ref 0–99)
Triglycerides: 220 mg/dL — ABNORMAL HIGH (ref 0–149)
VLDL Cholesterol Cal: 44 mg/dL — ABNORMAL HIGH (ref 5–40)

## 2018-02-19 LAB — HEPATITIS C ANTIBODY: Hep C Virus Ab: <0.1 s/co ratio (ref 0.0–0.9)

## 2018-04-04 LAB — HM COLONOSCOPY

## 2018-04-10 ENCOUNTER — Other Ambulatory Visit: Payer: Self-pay | Admitting: Family Medicine

## 2018-04-10 DIAGNOSIS — I10 Essential (primary) hypertension: Secondary | ICD-10-CM

## 2018-04-11 DIAGNOSIS — K5730 Diverticulosis of large intestine without perforation or abscess without bleeding: Secondary | ICD-10-CM | POA: Insufficient documentation

## 2018-04-12 ENCOUNTER — Encounter: Payer: Self-pay | Admitting: Family Medicine

## 2018-06-12 ENCOUNTER — Other Ambulatory Visit: Payer: Self-pay | Admitting: Family Medicine

## 2018-06-12 DIAGNOSIS — I10 Essential (primary) hypertension: Secondary | ICD-10-CM

## 2018-06-26 ENCOUNTER — Encounter: Payer: Self-pay | Admitting: Family Medicine

## 2018-06-26 ENCOUNTER — Ambulatory Visit (INDEPENDENT_AMBULATORY_CARE_PROVIDER_SITE_OTHER): Payer: 59 | Admitting: Family Medicine

## 2018-06-26 VITALS — BP 150/88 | HR 64 | Temp 97.7°F | Wt 209.4 lb

## 2018-06-26 DIAGNOSIS — G4733 Obstructive sleep apnea (adult) (pediatric): Secondary | ICD-10-CM

## 2018-06-26 DIAGNOSIS — D126 Benign neoplasm of colon, unspecified: Secondary | ICD-10-CM | POA: Diagnosis not present

## 2018-06-26 DIAGNOSIS — Z23 Encounter for immunization: Secondary | ICD-10-CM | POA: Diagnosis not present

## 2018-06-26 DIAGNOSIS — J309 Allergic rhinitis, unspecified: Secondary | ICD-10-CM | POA: Diagnosis not present

## 2018-06-26 DIAGNOSIS — N401 Enlarged prostate with lower urinary tract symptoms: Secondary | ICD-10-CM

## 2018-06-26 DIAGNOSIS — I10 Essential (primary) hypertension: Secondary | ICD-10-CM

## 2018-06-26 MED ORDER — METOPROLOL SUCCINATE ER 25 MG PO TB24: 25.0000 mg | ORAL_TABLET | Freq: Every day | ORAL | 3 refills | Status: DC
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
## 2018-12-23 ENCOUNTER — Ambulatory Visit (INDEPENDENT_AMBULATORY_CARE_PROVIDER_SITE_OTHER): Payer: 59 | Admitting: Family Medicine

## 2018-12-23 ENCOUNTER — Encounter: Payer: Self-pay | Admitting: Family Medicine

## 2018-12-23 ENCOUNTER — Other Ambulatory Visit: Payer: Self-pay

## 2018-12-23 VITALS — Temp 97.8°F | Wt 209.0 lb

## 2018-12-23 DIAGNOSIS — L0501 Pilonidal cyst with abscess: Secondary | ICD-10-CM | POA: Diagnosis not present

## 2018-12-23 MED ORDER — AMOXICILLIN-POT CLAVULANATE 875-125 MG PO TABS: 1.0000 | ORAL_TABLET | Freq: Two times a day (BID) | ORAL | 0 refills | Status: DC

## 2018-12-23 NOTE — Progress Notes (Signed)
   Subjective:    Patient ID: Jeffrey Forbes, male    DOB: [birth year], 68 y.o.   MRN: [id number]  HPI Documentation for virtual telephone encounter.  Documentation for virtual audio and video telecommunications through [place] encounter:  The patient was located at home. The provider was located in the office. The patient did consent to this visit and is aware of possible charges through their insurance for this visit. The other persons participating in this telemedicine service were none. Time spent on call was 5 minutes  This virtual service is not related to other E/M service within previous 7 days. He woke up this morning feeling some left-sided upper gluteal discomfort.  It was tender to palpation.   Review of Systems     Objective:   Physical Exam Alert and in no distress.  Visual inspection via doc 70 did show erythema with a central dark area.      Assessment & Plan:  Pilonidal cyst with abscess - Plan: amoxicillin-clavulanate (AUGMENTIN) 875-125 MG tablet I explained that I thought this was a pilonidal abscess and it could potentially get a lot worse.  I will call in an antibiotic however if it gets worse, he is to call and come in for an I&D he expressed understanding of this.

## 2019-01-02 ENCOUNTER — Encounter: Payer: Self-pay | Admitting: Medical

## 2019-01-02 ENCOUNTER — Other Ambulatory Visit: Payer: Self-pay

## 2019-01-02 ENCOUNTER — Ambulatory Visit (INDEPENDENT_AMBULATORY_CARE_PROVIDER_SITE_OTHER): Payer: 59 | Admitting: Medical

## 2019-01-02 DIAGNOSIS — L0501 Pilonidal cyst with abscess: Secondary | ICD-10-CM | POA: Diagnosis not present

## 2019-01-02 MED ORDER — SILVER SULFADIAZINE 1 % EX CREA: 1.0000 "application " | TOPICAL_CREAM | Freq: Every day | CUTANEOUS | 0 refills | Status: DC

## 2019-01-02 MED ORDER — AMOXICILLIN-POT CLAVULANATE 875-125 MG PO TABS: 1.0000 | ORAL_TABLET | Freq: Two times a day (BID) | ORAL | 0 refills | Status: DC

## 2019-01-02 NOTE — Progress Notes (Signed)
Subjective: Chief Complaint  Patient presents with  . follow up    follow up butt   Here for f/u on possible pilonidal cyst.   Did virtual visit with Dr. Redmond School 11 days ago for same.   He finished Augmentin yesterday.   The hot swelling went down quickly from last 11 days ago, but there is persistent hole, oozing pus, and possible necrotic tissue, not healing all the way.    Denies fever, no body aches or chills.   Has not had pilonidal prior, but has had other access in other places in the past.  No other aggravating or relieving factors. No other complaint.  Past Medical History:  Diagnosis Date  . Allergic rhinitis   . Hemorrhoids   . Hypertension   . Personal history of colonic polyps   . Sleep apnea    Current Outpatient Medications on File Prior to Visit  Medication Sig Dispense Refill  . aspirin 81 MG tablet Take 160 mg by mouth daily.    . cetirizine (ZYRTEC) 10 MG tablet Take 10 mg by mouth daily.    . fish oil-omega-3 fatty acids 1000 MG capsule Take 2 g by mouth daily.    Marland Kitchen losartan-hydrochlorothiazide (HYZAAR) 100-12.5 MG tablet TAKE 1 TABLET BY MOUTH EVERY DAY 90 tablet 3  . metoprolol succinate (TOPROL-XL) 25 MG 24 hr tablet Take 1 tablet (25 mg total) by mouth daily. 90 tablet 3  . mometasone (NASONEX) 50 MCG/ACT nasal spray Place 2 sprays into the nose daily. 17 g 12  . Multiple Vitamins-Minerals (MULTIVITAMIN WITH MINERALS) tablet Take 1 tablet by mouth daily.    . tamsulosin (FLOMAX) 0.4 MG CAPS capsule Take 0.4 mg by mouth.    . mupirocin ointment (BACTROBAN) 2 % Place 1 application into the nose 3 (three) times daily. (Patient not taking: Reported on 06/26/2018) 22 g 0  . potassium chloride (MICRO-K) 10 MEQ CR capsule Take 10 mEq by mouth 2 (two) times daily.    . traMADol (ULTRAM) 50 MG tablet Take 50 mg by mouth every 6 (six) hours as needed.     No current facility-administered medications on file prior to visit.    ROS as in subjective   Objective: BP  140/86   Pulse 62   Temp 98.2 F (36.8 C) (Temporal)   Resp 16   Ht 5' 10.5" (1.791 m)   Wt 205 lb 6.4 oz (93.2 kg)   SpO2 94%   BMI 29.06 kg/m   Gen: wd, wn, nad superior portion of gluteal fold/pilonidal area with 29mm x 62mm pinkish wound with opening, but no surrounding erythema, no induration, no fluctuance, no warmth, no drainage, seems to be healing    Assessment: Encounter Diagnosis  Name Primary?  . Pilonidal cyst with abscess      Plan: Much improved.  No indication for I&D today.   Begin Augmentin x 5 more days, topical silvadene cream x 1 week, good hygiene, and then call back if not gradually resolving.     Jeffrey Forbes was seen today for follow up.  Diagnoses and all orders for this visit:  Pilonidal cyst with abscess -     amoxicillin-clavulanate (AUGMENTIN) 875-125 MG tablet; Take 1 tablet by mouth 2 (two) times daily.  Other orders -     silver sulfADIAZINE (SILVADENE) 1 % cream; Apply 1 application topically daily.

## 2019-01-08 ENCOUNTER — Other Ambulatory Visit: Payer: Self-pay | Admitting: Family Medicine

## 2019-07-01 ENCOUNTER — Other Ambulatory Visit: Payer: Self-pay

## 2019-07-01 DIAGNOSIS — Z20822 Contact with and (suspected) exposure to covid-19: Secondary | ICD-10-CM

## 2019-07-02 LAB — NOVEL CORONAVIRUS, NAA: SARS-CoV-2, NAA: NOT DETECTED

## 2019-07-04 ENCOUNTER — Other Ambulatory Visit: Payer: Self-pay

## 2019-07-04 ENCOUNTER — Ambulatory Visit (INDEPENDENT_AMBULATORY_CARE_PROVIDER_SITE_OTHER): Payer: 59 | Admitting: Medical

## 2019-07-04 ENCOUNTER — Encounter: Payer: Self-pay | Admitting: Medical

## 2019-07-04 VITALS — BP 160/94 | HR 68 | Ht 70.5 in | Wt 206.0 lb

## 2019-07-04 DIAGNOSIS — Z96652 Presence of left artificial knee joint: Secondary | ICD-10-CM | POA: Insufficient documentation

## 2019-07-04 DIAGNOSIS — I10 Essential (primary) hypertension: Secondary | ICD-10-CM | POA: Diagnosis not present

## 2019-07-04 DIAGNOSIS — L0591 Pilonidal cyst without abscess: Secondary | ICD-10-CM

## 2019-07-04 DIAGNOSIS — G4733 Obstructive sleep apnea (adult) (pediatric): Secondary | ICD-10-CM | POA: Diagnosis not present

## 2019-07-04 DIAGNOSIS — M1712 Unilateral primary osteoarthritis, left knee: Secondary | ICD-10-CM

## 2019-07-04 MED ORDER — AMOXICILLIN-POT CLAVULANATE 875-125 MG PO TABS: 1.0000 | ORAL_TABLET | Freq: Two times a day (BID) | ORAL | 0 refills | Status: DC

## 2019-07-04 MED ORDER — LOSARTAN POTASSIUM 100 MG PO TABS: 100.0000 mg | ORAL_TABLET | Freq: Every day | ORAL | 0 refills | Status: DC

## 2019-07-04 MED ORDER — METOPROLOL SUCCINATE ER 50 MG PO TB24: 50.0000 mg | ORAL_TABLET | Freq: Every day | ORAL | 1 refills | Status: DC

## 2019-07-04 MED ORDER — HYDROCHLOROTHIAZIDE 12.5 MG PO TABS: 12.5000 mg | ORAL_TABLET | Freq: Every day | ORAL | 0 refills | Status: DC

## 2019-07-04 MED ORDER — TRAMADOL HCL 50 MG PO TABS: 50.0000 mg | ORAL_TABLET | Freq: Three times a day (TID) | ORAL | 0 refills | Status: AC | PRN

## 2019-07-04 NOTE — Progress Notes (Signed)
Subjective: Chief Complaint  Patient presents with  . Cyst    ON BUTT    Here for cyst on buttock.   Has had this several times in the same area towards top of butt crack for years.  I last saw him for this 12/2018.   No prior surgery for the same but it wont' just completley resolve.  antibiotics usually help but he is tired of the recurrence.  Current flare x 2 days, no pus drainage, but pain, pressure, redness. No fever, no nausea, no vomiting,no chills.    OSA - compliant with CPAP.    HTN - compliant with medication.   No chest pain, no dyspnea, no edema.     Seeing ortho, Dr. Len Childs in [place], [place].   Advised to have total knee replacement left knee.   No other aggravating or relieving factors.    No other c/o.  Past Medical History:  Diagnosis Date  . Allergic rhinitis   . Hemorrhoids   . Hypertension   . Personal history of colonic polyps   . Sleep apnea    Current Outpatient Medications on File Prior to Visit  Medication Sig Dispense Refill  . aspirin 81 MG tablet Take 160 mg by mouth daily.    . cetirizine (ZYRTEC) 10 MG tablet Take 10 mg by mouth daily.    . diclofenac (VOLTAREN) 75 MG EC tablet Take 75 mg by mouth 2 (two) times daily.    . finasteride (PROSCAR) 5 MG tablet Take 5 mg by mouth daily.    . fish oil-omega-3 fatty acids 1000 MG capsule Take 2 g by mouth daily.    . mometasone (NASONEX) 50 MCG/ACT nasal spray Place 2 sprays into the nose daily. 17 g 12  . Multiple Vitamins-Minerals (MULTIVITAMIN WITH MINERALS) tablet Take 1 tablet by mouth daily.    . tamsulosin (FLOMAX) 0.4 MG CAPS capsule Take 0.4 mg by mouth.    . losartan-hydrochlorothiazide (HYZAAR) 100-12.5 MG tablet TAKE 1 TABLET BY MOUTH EVERY DAY (Patient not taking: Reported on 07/04/2019) 90 tablet 3  . mupirocin ointment (BACTROBAN) 2 % Place 1 application into the nose 3 (three) times daily. (Patient not taking: Reported on 06/26/2018) 22 g 0  . potassium chloride (MICRO-K) 10 MEQ CR capsule  Take 10 mEq by mouth 2 (two) times daily.    . silver sulfADIAZINE (SILVADENE) 1 % cream Apply 1 application topically daily. (Patient not taking: Reported on 07/04/2019) 50 g 0  . traMADol (ULTRAM) 50 MG tablet Take 50 mg by mouth every 6 (six) hours as needed.     No current facility-administered medications on file prior to visit.     The following portions of the patient's history were reviewed and updated as appropriate: allergies, current medications, past family history, past medical history, past social history, past surgical history and problem list.   ROS Otherwise as in subjective above     Objective: BP (!) 160/94   Pulse 68   Ht 5' 10.5" (1.791 m)   Wt 206 lb (93.4 kg)   SpO2 96%   BMI 29.14 kg/m   Wt Readings from Last 3 Encounters:  07/04/19 206 lb (93.4 kg)  01/02/19 205 lb 6.4 oz (93.2 kg)  12/23/18 209 lb (94.8 kg)   BP Readings from Last 3 Encounters:  07/04/19 (!) 160/94  01/02/19 140/86  06/26/18 (!) 150/88    General appearance: alert, no distress, well developed, well nourished Heart rrr, normal s1,s2, no mumrurs Lungs clear  Upper left gluteal cleft with 2cm x 4cm somewhat tender slightly erythematous area, slight induration, no fluctuance, no warmth, no drainage Anus normal appearing, no obvious involvement further down gluteal cleft    Assessment: Encounter Diagnoses  Name Primary?  . Pilonidal cyst Yes  . Essential hypertension, benign   . Arthritis of left knee   . 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  Begin Augmentin in the meantime, can use Tramadol for pain if needed.  Discussed risks/benefits of medication.    HTN - not at goal.  C/t Lisinopril, HCT, but increase dose of Toprol.   Return soon with Dr. Redmond School his PCP for fasting physical appt as he is past due  Left knee OA - he has f/u with Dr. Len Childs in [facility], [place], is considering TKR.  OSA  - compliant with CPAP   Ion was seen today for cyst.  Diagnoses and all orders for this visit:  Pilonidal cyst -     Ambulatory referral to General Surgery  Essential hypertension, benign  Arthritis of left knee  Obstructive sleep apnea  Other orders -     amoxicillin-clavulanate (AUGMENTIN) 875-125 MG tablet; Take 1 tablet by mouth 2 (two) times daily. -     traMADol (ULTRAM) 50 MG tablet; Take 1 tablet (50 mg total) by mouth every 8 (eight) hours as needed for up to 5 days. -     metoprolol succinate (TOPROL XL) 50 MG 24 hr tablet; Take 1 tablet (50 mg total) by mouth daily. Take with or immediately following a meal. -     losartan (COZAAR) 100 MG tablet; Take 1 tablet (100 mg total) by mouth daily. -     hydrochlorothiazide (HYDRODIURIL) 12.5 MG tablet; Take 1 tablet (12.5 mg total) by mouth daily.   Follow up: soon for physical

## 2019-07-09 ENCOUNTER — Other Ambulatory Visit: Payer: Self-pay

## 2019-07-09 ENCOUNTER — Ambulatory Visit (INDEPENDENT_AMBULATORY_CARE_PROVIDER_SITE_OTHER): Payer: 59 | Admitting: Medical

## 2019-07-09 VITALS — BP 160/80 | HR 64 | Temp 97.1°F | Wt 207.0 lb

## 2019-07-09 DIAGNOSIS — L0501 Pilonidal cyst with abscess: Secondary | ICD-10-CM | POA: Diagnosis not present

## 2019-07-09 MED ORDER — AMOXICILLIN-POT CLAVULANATE 875-125 MG PO TABS: 1.0000 | ORAL_TABLET | Freq: Two times a day (BID) | ORAL | 0 refills | Status: DC

## 2019-07-09 NOTE — Progress Notes (Signed)
Subjective: Chief Complaint  Patient presents with  . painful cyst.    painful cyst x a week ago, some pus and blood.    Here for recheck on pilonidal cyst infected.  I saw him 07/04/2019 for same.  He began antibiotic.   On Monday 2 days ago had some spontaneous pus drainage.  However in last 24 hours, is now more tender again.  No fever, no chills, no nausea.    Has had this several times in the same area towards top of butt crack for years.  I last saw him for this 12/2018.   No prior surgery for the same but it wont' just completley resolve.  antibiotics usually help but he is tired of the recurrence.  Current flare x 2 days, no pus drainage, but pain, pressure, redness. No fever, no nausea, no vomiting,no chills.    No other aggravating or relieving factors.    No other c/o.  Past Medical History:  Diagnosis Date  . Allergic rhinitis   . Hemorrhoids   . Hypertension   . Personal history of colonic polyps   . Sleep apnea    Current Outpatient Medications on File Prior to Visit  Medication Sig Dispense Refill  . aspirin 81 MG tablet Take 160 mg by mouth daily.    . cetirizine (ZYRTEC) 10 MG tablet Take 10 mg by mouth daily.    . diclofenac (VOLTAREN) 75 MG EC tablet Take 75 mg by mouth 2 (two) times daily.    . finasteride (PROSCAR) 5 MG tablet Take 5 mg by mouth daily.    . fish oil-omega-3 fatty acids 1000 MG capsule Take 2 g by mouth daily.    . hydrochlorothiazide (HYDRODIURIL) 12.5 MG tablet Take 1 tablet (12.5 mg total) by mouth daily. 30 tablet 0  . losartan (COZAAR) 100 MG tablet Take 1 tablet (100 mg total) by mouth daily. 30 tablet 0  . losartan-hydrochlorothiazide (HYZAAR) 100-12.5 MG tablet TAKE 1 TABLET BY MOUTH EVERY DAY 90 tablet 3  . metoprolol succinate (TOPROL XL) 50 MG 24 hr tablet Take 1 tablet (50 mg total) by mouth daily. Take with or immediately following a meal. 30 tablet 1  . mometasone (NASONEX) 50 MCG/ACT nasal spray Place 2 sprays into the nose daily. 17  g 12  . Multiple Vitamins-Minerals (MULTIVITAMIN WITH MINERALS) tablet Take 1 tablet by mouth daily.    . potassium chloride (MICRO-K) 10 MEQ CR capsule Take 10 mEq by mouth 2 (two) times daily.    . tamsulosin (FLOMAX) 0.4 MG CAPS capsule Take 0.4 mg by mouth.    . traMADol (ULTRAM) 50 MG tablet Take 1 tablet (50 mg total) by mouth every 8 (eight) hours as needed for up to 5 days. (Patient not taking: Reported on 07/09/2019) 15 tablet 0   No current facility-administered medications on file prior to visit.     The following portions of the patient's history were reviewed and updated as appropriate: allergies, current medications, past family history, past medical history, past social history, past surgical history and problem list.   ROS Otherwise as in subjective above     Objective: BP (!) 160/80   Pulse 64   Temp (!) 97.1 F (36.2 C)   Wt 207 lb (93.9 kg)   BMI 29.28 kg/m   Wt Readings from Last 3 Encounters:  07/09/19 207 lb (93.9 kg)  07/04/19 206 lb (93.4 kg)  01/02/19 205 lb 6.4 oz (93.2 kg)   BP Readings from Last  3 Encounters:  07/09/19 (!) 160/80  07/04/19 (!) 160/94  01/02/19 140/86    General appearance: alert, no distress, well developed, well nourished Left superior gluteal cleft with broad 3cm area of somewhat tender slightly erythematous area, slight induration, no fluctuance, no warmth, no drainage Anus normal appearing, no obvious involvement further down gluteal cleft    Assessment: Encounter Diagnosis  Name Primary?  . Pilonidal cyst with abscess Yes     Plan: Pilonidal cyst -we discussed this findings.  Supervising physician Dr. Redmond School also came in and examined patient.  No obvious need for I&D today.  Continue antibiotic, continue hot soaks in the bathtub.  He has a tentative appointment with general surgery on December 11 although he will call back to asked to be put on the call list for sooner appointment.   Due to recurrence for several  years, referral to general surgery.  Ct/ Tramadol for pain if needed.  Discussed risks/benefits of medication.     Jeffrey Forbes was seen today for painful cyst..  Diagnoses and all orders for this visit:  Pilonidal cyst with abscess  Other orders -     amoxicillin-clavulanate (AUGMENTIN) 875-125 MG tablet; Take 1 tablet by mouth 2 (two) times daily.    Follow up: soon for physical

## 2019-07-25 ENCOUNTER — Other Ambulatory Visit: Payer: Self-pay | Admitting: Surgery

## 2019-08-05 ENCOUNTER — Other Ambulatory Visit: Payer: Self-pay | Admitting: Family Medicine

## 2019-08-05 DIAGNOSIS — I10 Essential (primary) hypertension: Secondary | ICD-10-CM

## 2019-08-11 ENCOUNTER — Telehealth: Payer: Self-pay | Admitting: Internal Medicine

## 2019-08-11 ENCOUNTER — Telehealth: Payer: Self-pay

## 2019-08-11 NOTE — Telephone Encounter (Signed)
He will need to quarantine for 10 days however as long as he remains symptom-free, set up to get a test done at day 5 or 6 and if it is negative when he gets it back on the seventh day, he can then go back to work.  I can write him a note.

## 2019-08-11 NOTE — Telephone Encounter (Signed)
Let him know that if he was within 6 feet for greater than 15 minutes of this person, and there were positive for Covid, he needs to quarantine for 10 days or if he can get tested around day 5 which should get the result back on day 7.  If he is negative, he can go back to work.

## 2019-08-11 NOTE — Telephone Encounter (Signed)
Pt called and states that he was exposed to someone on christmas eve at church. He is not having any symptoms and has been checking temp daily. Pt wanted to know if he needs to get tested and take time off work

## 2019-08-11 NOTE — Telephone Encounter (Signed)
Pt. Called stating he was exposed to his preacher at church on 08/07/19 he was with in 6 ft. Of him but he did have his mask on and the preacher is now positive for COVID, he stated his preacher did not look good during the service so he visited him the next day at his house on 08/08/19 and he wore his mask but the preacher did not and he was with in 6 ft. Of him, pt. siad he was told to quarantine for 10-14 days and he wanted to know you're recommendations and he may need a note for work since he works for the [employer]. Please advise.

## 2019-08-11 NOTE — Telephone Encounter (Signed)
Pt. Is aware of your recommendations and Pt. Said he is getting tested tomorrow and he will need a note to turn to his supervisor and we can email it to come at [email or web address].

## 2019-08-11 NOTE — Telephone Encounter (Signed)
Pt will be tested tomorrow and will need note to be out of work.

## 2019-08-12 ENCOUNTER — Encounter: Payer: Self-pay | Admitting: Family Medicine

## 2019-08-12 NOTE — Telephone Encounter (Signed)
Pt. Is aware and note has been emailed to the pt.

## 2019-08-12 NOTE — Telephone Encounter (Signed)
Note:

## 2019-08-14 ENCOUNTER — Other Ambulatory Visit: Payer: Self-pay | Admitting: Medical

## 2019-08-17 ENCOUNTER — Encounter: Payer: Self-pay | Admitting: Family Medicine

## 2019-08-28 ENCOUNTER — Encounter: Payer: Self-pay | Admitting: Family Medicine

## 2019-08-29 ENCOUNTER — Other Ambulatory Visit: Payer: Self-pay | Admitting: Medical

## 2019-09-05 ENCOUNTER — Ambulatory Visit (INDEPENDENT_AMBULATORY_CARE_PROVIDER_SITE_OTHER): Payer: 59 | Admitting: Family Medicine

## 2019-09-05 ENCOUNTER — Other Ambulatory Visit: Payer: Self-pay

## 2019-09-05 ENCOUNTER — Encounter: Payer: Self-pay | Admitting: Family Medicine

## 2019-09-05 ENCOUNTER — Telehealth: Payer: Self-pay

## 2019-09-05 VITALS — BP 140/88 | HR 59 | Temp 98.0°F | Ht 68.75 in | Wt 197.6 lb

## 2019-09-05 DIAGNOSIS — Z0000 Encounter for general adult medical examination without abnormal findings: Secondary | ICD-10-CM

## 2019-09-05 DIAGNOSIS — G4733 Obstructive sleep apnea (adult) (pediatric): Secondary | ICD-10-CM

## 2019-09-05 DIAGNOSIS — Z209 Contact with and (suspected) exposure to unspecified communicable disease: Secondary | ICD-10-CM | POA: Diagnosis not present

## 2019-09-05 DIAGNOSIS — M1712 Unilateral primary osteoarthritis, left knee: Secondary | ICD-10-CM | POA: Diagnosis not present

## 2019-09-05 DIAGNOSIS — B351 Tinea unguium: Secondary | ICD-10-CM

## 2019-09-05 DIAGNOSIS — N401 Enlarged prostate with lower urinary tract symptoms: Secondary | ICD-10-CM

## 2019-09-05 DIAGNOSIS — I10 Essential (primary) hypertension: Secondary | ICD-10-CM

## 2019-09-05 DIAGNOSIS — J309 Allergic rhinitis, unspecified: Secondary | ICD-10-CM

## 2019-09-05 MED ORDER — METOPROLOL SUCCINATE ER 100 MG PO TB24: 100.0000 mg | ORAL_TABLET | Freq: Every day | ORAL | 3 refills | Status: DC

## 2019-09-05 MED ORDER — FINASTERIDE 5 MG PO TABS: 5.0000 mg | ORAL_TABLET | Freq: Every day | ORAL | 3 refills | Status: DC

## 2019-09-05 MED ORDER — LOSARTAN POTASSIUM-HCTZ 100-12.5 MG PO TABS: 1.0000 | ORAL_TABLET | Freq: Every day | ORAL | 3 refills | Status: DC

## 2019-09-05 MED ORDER — TAMSULOSIN HCL 0.4 MG PO CAPS: 0.4000 mg | ORAL_CAPSULE | Freq: Every day | ORAL | 3 refills | Status: DC

## 2019-09-05 NOTE — Progress Notes (Signed)
   Subjective:    Patient ID: Jeffrey Forbes, male    DOB: 12/25/1950, 69 y.o.   MRN: [id number]  HPI He is here for complete examination.  He has a left TKR scheduled for late February and needs clearance for that.  He has had no chest pain, shortness of breath, pulmonary issues.  He does have hypertension and presently is on Hyzaar as well as Toprol.  He has OSA and says that it is not working well as he is sleepy.  He has not had a readout in approximately 1 year.  He continues on finasteride for BPH is well as Flomax and apparently these medications are working.  His allergies seem to be under good control on the OTC meds that he is taking and include Nasonex as well as Zyrtec.  He is sexually active.  He continues to work.  Family and social history as well as health maintenance and immunizations was reviewed.   Review of Systems  All other systems reviewed and are negative.      Objective:   Physical Exam Alert and in no distress. Tympanic membranes and canals are normal. Pharyngeal area is normal. Neck is supple without adenopathy or thyromegaly. Cardiac exam shows a regular sinus rhythm without murmurs or gallops. Lungs are clear to auscultation. Abdominal exam shows no masses or tenderness.  Exam mostly does show evidence of thickening of the nails.       Assessment & Plan:  Routine general medical examination at a health care facility  Contact with or exposure to communicable disease  Essential hypertension  Essential hypertension, benign  Arthritis of left knee  Obstructive sleep apnea  Allergic rhinitis, mild  Benign prostatic hyperplasia with lower urinary tract symptoms, symptom details unspecified  Onychomycosis I will increase his Toprol.  He is to return here in 1 month for recheck on his blood pressure.  Discussed rehab of his knee and strongly encouraged him to be very aggressive with the rehab on that.  He is also to send me a CPAP readout so I can evaluate  that further since he has not had this done in a year.  He is cleared for surgery.  His medications were renewed.

## 2019-09-05 NOTE — Telephone Encounter (Signed)
Pt advised he uses [facility] in [place]. [place]

## 2019-09-06 LAB — HIV ANTIBODY (ROUTINE TESTING W REFLEX): HIV Screen 4th Generation wRfx: NONREACTIVE

## 2019-09-06 LAB — CBC WITH DIFFERENTIAL/PLATELET
Basophils Absolute: 0.1 10*3/uL (ref 0.0–0.2)
Basos: 1 %
EOS (ABSOLUTE): 0.2 10*3/uL (ref 0.0–0.4)
Eos: 3 %
Hematocrit: 38.8 % (ref 37.5–51.0)
Hemoglobin: 13.9 g/dL (ref 13.0–17.7)
Immature Grans (Abs): 0 10*3/uL (ref 0.0–0.1)
Immature Granulocytes: 1 %
Lymphocytes Absolute: 1.8 10*3/uL (ref 0.7–3.1)
Lymphs: 27 %
MCH: 34.1 pg — ABNORMAL HIGH (ref 26.6–33.0)
MCHC: 35.8 g/dL — ABNORMAL HIGH (ref 31.5–35.7)
MCV: 95 fL (ref 79–97)
Monocytes Absolute: 0.5 10*3/uL (ref 0.1–0.9)
Monocytes: 8 %
Neutrophils Absolute: 3.9 10*3/uL (ref 1.4–7.0)
Neutrophils: 60 %
Platelets: 179 10*3/uL (ref 150–450)
RBC: 4.08 x10E6/uL — ABNORMAL LOW (ref 4.14–5.80)
RDW: 12.3 % (ref 11.6–15.4)
WBC: 6.5 10*3/uL (ref 3.4–10.8)

## 2019-09-06 LAB — COMPREHENSIVE METABOLIC PANEL
ALT: 29 IU/L (ref 0–44)
AST: 23 IU/L (ref 0–40)
Albumin/Globulin Ratio: 2.7 — ABNORMAL HIGH (ref 1.2–2.2)
Albumin: 4.6 g/dL (ref 3.8–4.8)
Alkaline Phosphatase: 75 IU/L (ref 39–117)
BUN/Creatinine Ratio: 13 (ref 10–24)
BUN: 14 mg/dL (ref 8–27)
Bilirubin Total: 1.1 mg/dL (ref 0.0–1.2)
CO2: 28 mmol/L (ref 20–29)
Calcium: 8.8 mg/dL (ref 8.6–10.2)
Chloride: 101 mmol/L (ref 96–106)
Creatinine, Ser: 1.04 mg/dL (ref 0.76–1.27)
GFR calc Af Amer: 85 mL/min/{1.73_m2} (ref >59)
GFR calc non Af Amer: 74 mL/min/{1.73_m2} (ref >59)
Globulin, Total: 1.7 g/dL (ref 1.5–4.5)
Glucose: 84 mg/dL (ref 65–99)
Potassium: 3.2 mmol/L — ABNORMAL LOW (ref 3.5–5.2)
Sodium: 143 mmol/L (ref 134–144)
Total Protein: 6.3 g/dL (ref 6.0–8.5)

## 2019-09-06 LAB — GC/CHLAMYDIA PROBE AMP
Chlamydia trachomatis, NAA: NEGATIVE
Neisseria Gonorrhoeae by PCR: NEGATIVE

## 2019-09-06 LAB — LIPID PANEL
Chol/HDL Ratio: 5.4 ratio — ABNORMAL HIGH (ref 0.0–5.0)
Cholesterol, Total: 182 mg/dL (ref 100–199)
HDL: 34 mg/dL — ABNORMAL LOW (ref >39)
LDL Chol Calc (NIH): 116 mg/dL — ABNORMAL HIGH (ref 0–99)
Triglycerides: 178 mg/dL — ABNORMAL HIGH (ref 0–149)
VLDL Cholesterol Cal: 32 mg/dL (ref 5–40)

## 2019-09-06 LAB — RPR: RPR Ser Ql: NONREACTIVE

## 2019-09-29 ENCOUNTER — Other Ambulatory Visit: Payer: Self-pay | Admitting: Medical

## 2019-09-29 ENCOUNTER — Other Ambulatory Visit: Payer: Self-pay | Admitting: Family Medicine

## 2019-09-29 NOTE — Telephone Encounter (Signed)
Looks like 09/04/19 visit, these meds were discontinued

## 2019-09-30 ENCOUNTER — Other Ambulatory Visit: Payer: Self-pay

## 2019-09-30 ENCOUNTER — Encounter: Payer: Self-pay | Admitting: Medical

## 2019-09-30 ENCOUNTER — Ambulatory Visit (INDEPENDENT_AMBULATORY_CARE_PROVIDER_SITE_OTHER): Payer: 59 | Admitting: Medical

## 2019-09-30 VITALS — BP 144/90 | HR 58 | Temp 97.7°F | Ht 68.75 in | Wt 203.6 lb

## 2019-09-30 DIAGNOSIS — R300 Dysuria: Secondary | ICD-10-CM

## 2019-09-30 DIAGNOSIS — M67479 Ganglion, unspecified ankle and foot: Secondary | ICD-10-CM | POA: Diagnosis not present

## 2019-09-30 DIAGNOSIS — Z113 Encounter for screening for infections with a predominantly sexual mode of transmission: Secondary | ICD-10-CM

## 2019-09-30 LAB — POCT URINALYSIS DIP (PROADVANTAGE DEVICE)
Bilirubin, UA: NEGATIVE
Blood, UA: NEGATIVE
Glucose, UA: NEGATIVE mg/dL
Ketones, POC UA: NEGATIVE mg/dL
Leukocytes, UA: NEGATIVE
Nitrite, UA: NEGATIVE
Protein Ur, POC: NEGATIVE mg/dL
Specific Gravity, Urine: 1.01
Urobilinogen, Ur: NEGATIVE
pH, UA: 7.5 (ref 5.0–8.0)

## 2019-09-30 NOTE — Addendum Note (Signed)
Addended by: Edgar Frisk on: 09/30/2019 02:16 PM   Modules accepted: Orders

## 2019-09-30 NOTE — Progress Notes (Signed)
Subjective: Chief Complaint  Patient presents with  . painful urination   Here for c/o painful urination that started yesterday.  Has some burning sensation.  Yesterday noticed at beginning of stream had burning and pain.  This morning feeling more constancy to the pain.  No penile discharge.   No blood in urine.   No testicular pain or swelling, no rash.   Drinks a lot of water.   Not married.  Has 1 partner male, but monogamous.   Doesn't not use condoms.  No belly or back pain, no fever, no nausea, no vomiting.  Has tender area of left 2nd toe that hurts when dog steps on it.  No other aggravating or relieving factors. No other complaint.   Past Medical History:  Diagnosis Date  . Allergic rhinitis   . Hemorrhoids   . Hypertension   . Personal history of colonic polyps   . Sleep apnea    Current Outpatient Medications on File Prior to Visit  Medication Sig Dispense Refill  . cetirizine (ZYRTEC) 10 MG tablet Take 10 mg by mouth daily.    . finasteride (PROSCAR) 5 MG tablet Take 1 tablet (5 mg total) by mouth daily. 90 tablet 3  . losartan-hydrochlorothiazide (HYZAAR) 100-12.5 MG tablet Take 1 tablet by mouth daily. 90 tablet 3  . metoprolol succinate (TOPROL-XL) 100 MG 24 hr tablet Take 1 tablet (100 mg total) by mouth daily. Take with or immediately following a meal. 90 tablet 3  . mometasone (NASONEX) 50 MCG/ACT nasal spray Place 2 sprays into the nose daily. 17 g 12  . mupirocin ointment (BACTROBAN) 2 %     . tamsulosin (FLOMAX) 0.4 MG CAPS capsule Take 1 capsule (0.4 mg total) by mouth daily. 90 capsule 3  . amoxicillin-clavulanate (AUGMENTIN) 875-125 MG tablet Take 1 tablet by mouth 2 (two) times daily. (Patient not taking: Reported on 09/30/2019) 20 tablet 0  . aspirin 81 MG tablet Take 160 mg by mouth daily.    . fish oil-omega-3 fatty acids 1000 MG capsule Take 2 g by mouth daily.    . Multiple Vitamins-Minerals (MULTIVITAMIN WITH MINERALS) tablet Take 1 tablet by mouth  daily.    . potassium chloride (MICRO-K) 10 MEQ CR capsule Take 10 mEq by mouth 2 (two) times daily.     No current facility-administered medications on file prior to visit.   ROS as in subjective    Objective: BP (!) 144/90   Pulse (!) 58   Temp 97.7 F (36.5 C)   Ht 5' 8.75" (1.746 m)   Wt 203 lb 9.6 oz (92.4 kg)   SpO2 94%   BMI 30.29 kg/m   Gen: wd, wn, nad Abdomen nontender, no mass, no organomegaly Back nontender GU: normal male, nontender, no mass, no swelling, no deformity, circ Left 2nd toe medial side with somewhat mobile subcutaneous lump roughly 3-4 mm diameter suggestive of retention cyst, tender, but no erythema, no fluctuance    Assessment: Encounter Diagnoses  Name Primary?  . Dysuria Yes  . Mucous cyst of toe   . Screen for STD (sexually transmitted disease)       Plan: Dysuria - urine pH 7.5, otherwise normal.   Testing as below. Hydrate well, consider cranberry juice the next few days.   F/u pending labs  Cyst of toe - advised consult with podiatry.  He will let me know if desired.  Jeffrey Forbes was seen today for painful urination.  Diagnoses and all orders for this visit:  Dysuria -     Urine Culture -     Chlamydia/Gonococcus/Trichomonas, NAA  Mucous cyst of toe  Screen for STD (sexually transmitted disease)

## 2019-10-01 LAB — URINE CULTURE: Organism ID, Bacteria: NO GROWTH

## 2019-10-01 LAB — CHLAMYDIA/GONOCOCCUS/TRICHOMONAS, NAA
Chlamydia by NAA: NEGATIVE
Gonococcus by NAA: NEGATIVE
Trich vag by NAA: NEGATIVE

## 2019-10-02 ENCOUNTER — Other Ambulatory Visit: Payer: Self-pay | Admitting: Medical

## 2019-10-02 MED ORDER — DOXYCYCLINE MONOHYDRATE 100 MG PO TABS: 100.0000 mg | ORAL_TABLET | Freq: Two times a day (BID) | ORAL | 0 refills | Status: DC

## 2019-10-06 ENCOUNTER — Ambulatory Visit (INDEPENDENT_AMBULATORY_CARE_PROVIDER_SITE_OTHER): Payer: 59 | Admitting: Family Medicine

## 2019-10-06 ENCOUNTER — Other Ambulatory Visit: Payer: Self-pay

## 2019-10-06 VITALS — BP 132/96 | HR 59 | Temp 96.6°F | Wt 200.0 lb

## 2019-10-06 DIAGNOSIS — R300 Dysuria: Secondary | ICD-10-CM | POA: Diagnosis not present

## 2019-10-06 DIAGNOSIS — I10 Essential (primary) hypertension: Secondary | ICD-10-CM

## 2019-10-06 NOTE — Progress Notes (Signed)
   Subjective:    Patient ID: Jeffrey Forbes, male    DOB: 10-03-1950, 69 y.o.   MRN: [id number]  HPI He is here for recheck on his blood pressure.  He was also recently treated for dysuria and was given doxycycline.  He does state that he is having less symptoms from that.  He is also taking Flomax.  He does have a left TKR scheduled in several days and I recommend that he let the surgeon know that he is on an antibiotic. He is also considering getting involved in a blood pressure study at [facility].  Review of Systems     Objective:   Physical Exam Alert and in no distress.  Blood pressure is recorded       Assessment & Plan:  Dysuria - Plan: POCT Urinalysis DIP (Proadvantage Device)  Essential hypertension Recommend he continue on the antibiotic until it is gone and call if any problems. Explained that his blood pressure is not adequately controlled but we will wait to hear from him concerning whether he will get involved in the [facility] study.

## 2019-10-07 LAB — POCT URINALYSIS DIP (PROADVANTAGE DEVICE)
Bilirubin, UA: NEGATIVE
Blood, UA: NEGATIVE
Glucose, UA: NEGATIVE mg/dL
Ketones, POC UA: NEGATIVE mg/dL
Leukocytes, UA: NEGATIVE
Nitrite, UA: NEGATIVE
Protein Ur, POC: NEGATIVE mg/dL
Specific Gravity, Urine: 1.02
Urobilinogen, Ur: 0.2
pH, UA: 6 (ref 5.0–8.0)

## 2019-10-09 MED ORDER — BISACODYL 10 MG RE SUPP
10.00 | RECTAL | Status: DC
Start: ? — End: 2019-10-09

## 2019-10-09 MED ORDER — GENERIC EXTERNAL MEDICATION
Status: DC
Start: ? — End: 2019-10-09

## 2019-10-09 MED ORDER — DOXYCYCLINE HYCLATE 100 MG PO TABS
100.00 | ORAL_TABLET | ORAL | Status: DC
Start: 2019-10-09 — End: 2019-10-09

## 2019-10-09 MED ORDER — NALOXONE HCL 0.4 MG/ML IJ SOLN
0.40 | INTRAMUSCULAR | Status: DC
Start: ? — End: 2019-10-09

## 2019-10-09 MED ORDER — ALUM & MAG HYDROXIDE-SIMETH 200-200-20 MG/5ML PO SUSP
30.00 | ORAL | Status: DC
Start: ? — End: 2019-10-09

## 2019-10-09 MED ORDER — ONDANSETRON HCL 4 MG/2ML IJ SOLN
4.00 | INTRAMUSCULAR | Status: DC
Start: ? — End: 2019-10-09

## 2019-10-09 MED ORDER — SENNOSIDES-DOCUSATE SODIUM 8.6-50 MG PO TABS
2.00 | ORAL_TABLET | ORAL | Status: DC
Start: 2019-10-09 — End: 2019-10-09

## 2019-10-09 MED ORDER — NALOXONE HCL 0.4 MG/ML IJ SOLN
0.10 | INTRAMUSCULAR | Status: DC
Start: ? — End: 2019-10-09

## 2019-10-09 MED ORDER — TRAMADOL HCL 50 MG PO TABS
100.00 | ORAL_TABLET | ORAL | Status: DC
Start: 2019-10-10 — End: 2019-10-09

## 2019-10-09 MED ORDER — FINASTERIDE 5 MG PO TABS
5.00 | ORAL_TABLET | ORAL | Status: DC
Start: 2019-10-10 — End: 2019-10-09

## 2019-10-09 MED ORDER — CETIRIZINE HCL 10 MG PO TABS
10.00 | ORAL_TABLET | ORAL | Status: DC
Start: 2019-10-10 — End: 2019-10-09

## 2019-10-09 MED ORDER — ENEMA 7-19 GM/118ML RE ENEM
1.00 | ENEMA | RECTAL | Status: DC
Start: ? — End: 2019-10-09

## 2019-10-09 MED ORDER — TAMSULOSIN HCL 0.4 MG PO CAPS
0.40 | ORAL_CAPSULE | ORAL | Status: DC
Start: 2019-10-10 — End: 2019-10-09

## 2019-10-09 MED ORDER — MUPIROCIN 2 % EX OINT
TOPICAL_OINTMENT | CUTANEOUS | Status: DC
Start: 2019-10-09 — End: 2019-10-09

## 2019-10-09 MED ORDER — METOPROLOL SUCCINATE ER 50 MG PO TB24
100.00 | ORAL_TABLET | ORAL | Status: DC
Start: 2019-10-10 — End: 2019-10-09

## 2019-10-09 MED ORDER — CYCLOBENZAPRINE HCL 10 MG PO TABS
10.00 | ORAL_TABLET | ORAL | Status: DC
Start: ? — End: 2019-10-09

## 2019-10-09 MED ORDER — ASPIRIN 325 MG PO TBEC
325.00 | DELAYED_RELEASE_TABLET | ORAL | Status: DC
Start: 2019-10-09 — End: 2019-10-09

## 2019-10-09 MED ORDER — SODIUM CHLORIDE 0.9 % IV SOLN
INTRAVENOUS | Status: DC
Start: ? — End: 2019-10-09

## 2019-10-09 MED ORDER — CELECOXIB 200 MG PO CAPS
200.00 | ORAL_CAPSULE | ORAL | Status: DC
Start: 2019-10-09 — End: 2019-10-09

## 2019-10-09 MED ORDER — FLUTICASONE PROPIONATE 50 MCG/ACT NA SUSP
1.00 | NASAL | Status: DC
Start: 2019-10-10 — End: 2019-10-09

## 2019-11-14 LAB — COLOGUARD: Cologuard: POSITIVE — AB

## 2019-11-17 ENCOUNTER — Encounter: Payer: Self-pay | Admitting: Family Medicine

## 2019-12-09 ENCOUNTER — Encounter: Payer: Self-pay | Admitting: Family Medicine

## 2020-02-25 ENCOUNTER — Encounter: Payer: Self-pay | Admitting: Family Medicine

## 2020-04-02 ENCOUNTER — Ambulatory Visit: Payer: 59 | Admitting: Family Medicine

## 2020-04-05 ENCOUNTER — Encounter: Payer: Self-pay | Admitting: Family Medicine

## 2020-04-05 ENCOUNTER — Other Ambulatory Visit: Payer: Self-pay

## 2020-04-05 ENCOUNTER — Ambulatory Visit (INDEPENDENT_AMBULATORY_CARE_PROVIDER_SITE_OTHER): Payer: 59 | Admitting: Family Medicine

## 2020-04-05 VITALS — BP 130/84 | HR 58 | Temp 97.8°F | Wt 208.2 lb

## 2020-04-05 DIAGNOSIS — I10 Essential (primary) hypertension: Secondary | ICD-10-CM

## 2020-04-05 NOTE — Progress Notes (Signed)
   Subjective:    Patient ID: Jeffrey Forbes, male    DOB: December 07, 1950, 69 y.o.   MRN: [id number]  HPI He is here for recheck on his blood pressure. He is on Hyzaar and is also taking Flomax for his bladder symptoms. He is getting ready to get involved in the study at [facility] concerning a new blood pressure therapy. He also has a lesion in the mid gluteal area that apparently was draining but is cleared up in the last several days.   Review of Systems     Objective:   Physical Exam Alert and in no distress. Blood pressure is recorded and is fairly decent. Exam of the anal area does show a healing lesion on the left upper buttock near the midline but not really the pilonidal area. There is no drainage or induration.      Assessment & Plan:  Essential hypertension Discuss possible medication changes since after reviewing his blood pressure readings, it would not hurt them to be a little lower. Discussed possibly switching him from Flomax to Terazosin in versus waiting on the wait for study. He will wait on the study. No therapy for the gluteal lesion as it does seem to be healing. He was comfortable with this.

## 2020-09-10 ENCOUNTER — Telehealth: Payer: Self-pay

## 2020-09-10 ENCOUNTER — Other Ambulatory Visit: Payer: Self-pay | Admitting: Family Medicine

## 2020-09-10 DIAGNOSIS — I10 Essential (primary) hypertension: Secondary | ICD-10-CM

## 2020-09-10 NOTE — Telephone Encounter (Signed)
LVM for pt to all and schedule a med check and annual exam. [facility]

## 2020-10-04 ENCOUNTER — Encounter: Payer: 59 | Admitting: Family Medicine

## 2020-11-02 ENCOUNTER — Encounter: Payer: Self-pay | Admitting: Family Medicine

## 2020-11-04 ENCOUNTER — Other Ambulatory Visit: Payer: Self-pay

## 2020-11-04 ENCOUNTER — Ambulatory Visit (INDEPENDENT_AMBULATORY_CARE_PROVIDER_SITE_OTHER): Payer: 59 | Admitting: Family Medicine

## 2020-11-04 VITALS — BP 158/90 | HR 61 | Temp 97.2°F | Wt 200.8 lb

## 2020-11-04 DIAGNOSIS — N401 Enlarged prostate with lower urinary tract symptoms: Secondary | ICD-10-CM

## 2020-11-04 DIAGNOSIS — I10 Essential (primary) hypertension: Secondary | ICD-10-CM | POA: Diagnosis not present

## 2020-11-04 DIAGNOSIS — J309 Allergic rhinitis, unspecified: Secondary | ICD-10-CM

## 2020-11-04 DIAGNOSIS — G4733 Obstructive sleep apnea (adult) (pediatric): Secondary | ICD-10-CM

## 2020-11-04 DIAGNOSIS — D126 Benign neoplasm of colon, unspecified: Secondary | ICD-10-CM

## 2020-11-04 DIAGNOSIS — E785 Hyperlipidemia, unspecified: Secondary | ICD-10-CM

## 2020-11-04 DIAGNOSIS — Z96652 Presence of left artificial knee joint: Secondary | ICD-10-CM | POA: Diagnosis not present

## 2020-11-04 MED ORDER — METOPROLOL SUCCINATE ER 100 MG PO TB24
100.0000 mg | ORAL_TABLET | Freq: Every day | ORAL | 3 refills | Status: DC
Start: 2020-11-04 — End: 2021-12-16

## 2020-11-04 MED ORDER — LOSARTAN POTASSIUM-HCTZ 100-12.5 MG PO TABS: 1.0000 | ORAL_TABLET | Freq: Every day | ORAL | 3 refills | Status: DC

## 2020-11-04 MED ORDER — TAMSULOSIN HCL 0.4 MG PO CAPS: 0.4000 mg | ORAL_CAPSULE | Freq: Every day | ORAL | 3 refills | Status: DC

## 2020-11-04 MED ORDER — FINASTERIDE 5 MG PO TABS
5.0000 mg | ORAL_TABLET | Freq: Every day | ORAL | 3 refills | Status: DC
Start: 2020-11-04 — End: 2021-12-16

## 2020-11-04 NOTE — Progress Notes (Signed)
   Subjective:    Patient ID: Jeffrey Forbes, male    DOB: 1951/08/07, 70 y.o.   MRN: [id number]  HPI He is here for an interval evaluation.  He complains of a several month history of difficulty with excessive flatus and cannot relate this to any change in his medications or eating habits.  He continues on losartan/HCTZ as well as metoprolol for his blood pressure and having no difficulty with that.  He is also taking finasteride and tamsulosin was then for his bladder/prostate related issues.  He does have a history of adenomatous colonic polyp and is scheduled for routine follow-up on that.  His allergies seem to be under good control.  His CPAP is working well for his OSA.  He did have a left TKR and is very happy with the results of this.   Review of Systems     Objective:   Physical Exam Alert and in no distress. Tympanic membranes and canals are normal. Pharyngeal area is normal. Neck is supple without adenopathy or thyromegaly. Cardiac exam shows a regular sinus rhythm without murmurs or gallops. Lungs are clear to auscultation.        Assessment & Plan:  Allergic rhinitis, mild - Plan: CBC with Differential/Platelet, Comprehensive metabolic panel  Obstructive sleep apnea  Essential hypertension - Plan: CBC with Differential/Platelet, Comprehensive metabolic panel  S/P TKR (total knee replacement), left  Benign prostatic hyperplasia with lower urinary tract symptoms, symptom details unspecified  Adenomatous polyp of colon, unspecified part of colon  Hyperlipidemia, unspecified hyperlipidemia type - Plan: Lipid panel

## 2020-11-05 LAB — COMPREHENSIVE METABOLIC PANEL
ALT: 18 IU/L (ref 0–44)
AST: 20 IU/L (ref 0–40)
Albumin/Globulin Ratio: 2.1 (ref 1.2–2.2)
Albumin: 4.4 g/dL (ref 3.8–4.8)
Alkaline Phosphatase: 91 IU/L (ref 44–121)
BUN/Creatinine Ratio: 17 (ref 10–24)
BUN: 15 mg/dL (ref 8–27)
Bilirubin Total: 0.5 mg/dL (ref 0.0–1.2)
CO2: 27 mmol/L (ref 20–29)
Calcium: 9 mg/dL (ref 8.6–10.2)
Chloride: 102 mmol/L (ref 96–106)
Creatinine, Ser: 0.89 mg/dL (ref 0.76–1.27)
Globulin, Total: 2.1 g/dL (ref 1.5–4.5)
Glucose: 108 mg/dL — ABNORMAL HIGH (ref 65–99)
Potassium: 3.2 mmol/L — ABNORMAL LOW (ref 3.5–5.2)
Sodium: 144 mmol/L (ref 134–144)
Total Protein: 6.5 g/dL (ref 6.0–8.5)
eGFR: 93 mL/min/{1.73_m2} (ref >59)

## 2020-11-05 LAB — CBC WITH DIFFERENTIAL/PLATELET
Basophils Absolute: 0 10*3/uL (ref 0.0–0.2)
Basos: 1 %
EOS (ABSOLUTE): 0.3 10*3/uL (ref 0.0–0.4)
Eos: 4 %
Hematocrit: 43.5 % (ref 37.5–51.0)
Hemoglobin: 14.9 g/dL (ref 13.0–17.7)
Immature Grans (Abs): 0 10*3/uL (ref 0.0–0.1)
Immature Granulocytes: 0 %
Lymphocytes Absolute: 1.8 10*3/uL (ref 0.7–3.1)
Lymphs: 30 %
MCH: 31.6 pg (ref 26.6–33.0)
MCHC: 34.3 g/dL (ref 31.5–35.7)
MCV: 92 fL (ref 79–97)
Monocytes Absolute: 0.6 10*3/uL (ref 0.1–0.9)
Monocytes: 9 %
Neutrophils Absolute: 3.3 10*3/uL (ref 1.4–7.0)
Neutrophils: 56 %
Platelets: 166 10*3/uL (ref 150–450)
RBC: 4.71 x10E6/uL (ref 4.14–5.80)
RDW: 12.6 % (ref 11.6–15.4)
WBC: 6 10*3/uL (ref 3.4–10.8)

## 2020-11-05 LAB — LIPID PANEL
Chol/HDL Ratio: 4.8 ratio (ref 0.0–5.0)
Cholesterol, Total: 173 mg/dL (ref 100–199)
HDL: 36 mg/dL — ABNORMAL LOW (ref >39)
LDL Chol Calc (NIH): 88 mg/dL (ref 0–99)
Triglycerides: 296 mg/dL — ABNORMAL HIGH (ref 0–149)
VLDL Cholesterol Cal: 49 mg/dL — ABNORMAL HIGH (ref 5–40)

## 2021-03-08 ENCOUNTER — Ambulatory Visit (INDEPENDENT_AMBULATORY_CARE_PROVIDER_SITE_OTHER): Payer: 59 | Admitting: Family Medicine

## 2021-03-08 ENCOUNTER — Encounter: Payer: Self-pay | Admitting: Family Medicine

## 2021-03-08 VITALS — BP 134/88 | HR 64 | Temp 97.2°F | Ht 69.5 in | Wt 208.6 lb

## 2021-03-08 DIAGNOSIS — D126 Benign neoplasm of colon, unspecified: Secondary | ICD-10-CM

## 2021-03-08 DIAGNOSIS — I10 Essential (primary) hypertension: Secondary | ICD-10-CM | POA: Diagnosis not present

## 2021-03-08 DIAGNOSIS — K5730 Diverticulosis of large intestine without perforation or abscess without bleeding: Secondary | ICD-10-CM

## 2021-03-08 DIAGNOSIS — Z0000 Encounter for general adult medical examination without abnormal findings: Secondary | ICD-10-CM

## 2021-03-08 DIAGNOSIS — G4733 Obstructive sleep apnea (adult) (pediatric): Secondary | ICD-10-CM

## 2021-03-08 DIAGNOSIS — Z96652 Presence of left artificial knee joint: Secondary | ICD-10-CM

## 2021-03-08 DIAGNOSIS — J309 Allergic rhinitis, unspecified: Secondary | ICD-10-CM

## 2021-03-08 DIAGNOSIS — N401 Enlarged prostate with lower urinary tract symptoms: Secondary | ICD-10-CM

## 2021-03-08 DIAGNOSIS — E785 Hyperlipidemia, unspecified: Secondary | ICD-10-CM

## 2021-03-08 MED ORDER — ATORVASTATIN CALCIUM 20 MG PO TABS: 20.0000 mg | ORAL_TABLET | Freq: Every day | ORAL | 3 refills | Status: DC

## 2021-03-08 NOTE — Progress Notes (Signed)
Established Patient Office Visit  Subjective:  Patient ID: Jeffrey Forbes, male    DOB: May 27, 1951  Age: 70 y.o. MRN: [id number]  CC:  Chief Complaint  Patient presents with   Annual Exam    Fasting     HPI Jeffrey Forbes presents for a complete exam.  He had COVID in June of this year.  He was given a Z-Pak as well as prednisone and  had a follow-up appointment about a month later.  Apparently an x-ray did show some changes and he was sent to pulmonary.  Apparently lung functions were done which did show a 30% reduction.  He is scheduled for follow-up in 6 months.  He does have underlying allergies which seem to be under good control.  He takes Zyrtec and Nasonex.  He also has OSA and does use a CPAP.  He has not seen a readout and I asked him to get a readout for me.  He does have BPH and is on finasteride and Flomax.  This seems to be helping him well.  He has had a colonoscopy which showed adenomatous colonic polyps and is scheduled for routine follow-up on that.  He also has evidence of diverticulosis.  He continues on losartan/HCTZ as well as metoprolol for his blood pressure.  Previous blood work did show elevated lipid panel.  He has had a TKR and has done well with that.  He has no particular concerns or complaints other than as above.  Past Medical History:  Diagnosis Date   Allergic rhinitis    Hemorrhoids    Hypertension    Personal history of colonic polyps    Sleep apnea     Past Surgical History:  Procedure Laterality Date   COLONOSCOPY  2008   Dr. 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    Employer: [employer]  Tobacco Use   Smoking status: Never    Smokeless tobacco: Never  Substance and Sexual Activity   Alcohol use: Yes    Comment: occasional (1-2/month)   Drug use: No   Sexual activity: Yes  Other Topics Concern   Not on file  Social History Narrative   Not on file   Social Determinants of Health   Financial Resource Strain: Not on file  Food Insecurity: Not on file  Transportation Needs: Not on file  Physical Activity: Not on file  Stress: Not on file  Social Connections: Not on file  Intimate Partner Violence: Not on file    Outpatient Medications Prior to Visit  Medication Sig Dispense Refill   aspirin 81 MG tablet Take 160 mg by mouth daily.      cetirizine (ZYRTEC) 10 MG tablet Take 10 mg by mouth daily.     finasteride (PROSCAR) 5 MG tablet Take 1 tablet (5 mg total) by mouth daily. 90 tablet 3   losartan-hydrochlorothiazide (HYZAAR) 100-12.5 MG tablet Take 1 tablet by mouth daily. 90 tablet 3   metoprolol succinate (TOPROL-XL) 100 MG 24 hr tablet Take 1 tablet (100 mg total) by mouth daily. Take with or immediately following a  meal. 90 tablet 3   mometasone (NASONEX) 50 MCG/ACT nasal spray Place 2 sprays into the nose daily. 17 g 12   Multiple Vitamins-Minerals (MULTIVITAMIN WITH MINERALS) tablet Take 1 tablet by mouth daily.      tamsulosin (FLOMAX) 0.4 MG CAPS capsule Take 1 capsule (0.4 mg total) by mouth daily. 90 capsule 3   diclofenac (VOLTAREN) 75 MG EC tablet Take by mouth. (Patient not taking: No sig reported)     doxycycline (ADOXA) 100 MG tablet Take 1 tablet (100 mg total) by mouth 2 (two) times daily. (Patient not taking: No sig reported) 20 tablet 0   fish oil-omega-3 fatty acids 1000 MG capsule Take 2 g by mouth daily. (Patient not taking: No sig reported)     mupirocin ointment (BACTROBAN) 2 %  (Patient not taking: No sig reported)     potassium chloride (MICRO-K) 10 MEQ CR capsule Take 10 mEq by mouth 2 (two) times daily. (Patient not taking: No sig reported)     No facility-administered  medications prior to visit.    No Known Allergies  ROS Review of Systems  All other systems reviewed and are negative.    Objective:    Physical Exam Alert and in no distress. Tympanic membranes and canals are normal. Pharyngeal area is normal. Neck is supple without adenopathy or thyromegaly. Cardiac exam shows a regular sinus rhythm without murmurs or gallops. Lungs are clear abdominal exam shows no masses or tenderness with normal bowel sounds    Health Maintenance Due  Topic Date Due   COVID-19 Vaccine (4 - Booster for Pfizer series) 09/21/2020    There are no preventive care reminders to display for this patient.  Lab Results  Component Value Date   TSH 1.01 08/02/2017   Lab Results  Component Value Date   WBC 6.0 11/04/2020   HGB 14.9 11/04/2020   HCT 43.5 11/04/2020   MCV 92 11/04/2020   PLT 166 11/04/2020   Lab Results  Component Value Date   NA 144 11/04/2020   K 3.2 (L) 11/04/2020   CO2 27 11/04/2020   GLUCOSE 108 (H) 11/04/2020   BUN 15 11/04/2020   CREATININE 0.89 11/04/2020   BILITOT 0.5 11/04/2020   ALKPHOS 91 11/04/2020   AST 20 11/04/2020   ALT 18 11/04/2020   PROT 6.5 11/04/2020   ALBUMIN 4.4 11/04/2020   CALCIUM 9.0 11/04/2020   EGFR 93 11/04/2020   Lab Results  Component Value Date   CHOL 173 11/04/2020   Lab Results  Component Value Date   HDL 36 (L) 11/04/2020   Lab Results  Component Value Date   LDLCALC 88 11/04/2020   Lab Results  Component Value Date   TRIG 296 (H) 11/04/2020   Lab Results  Component Value Date   CHOLHDL 4.8 11/04/2020   Lab Results  Component Value Date   HGBA1C 4.5 02/22/2017      Assessment & Plan:  Routine general medical examination at a health care facility  Allergic rhinitis, mild  Obstructive sleep apnea  Essential hypertension  Adenomatous polyp of colon, unspecified part of colon  Benign prostatic hyperplasia with lower urinary tract symptoms, symptom details  unspecified  Hyperlipidemia, unspecified hyperlipidemia type - Plan: atorvastatin (LIPITOR) 20 MG tablet  S/P TKR (total knee replacement), left  Diverticulosis of sigmoid colon I discussed the fact that he is not over age 70 and because of that a statin drug is warranted.  I will place him on Lipitor and follow-up.  He will continue on his present medication regimen.    Follow-up: No follow-ups on file.

## 2021-04-28 ENCOUNTER — Other Ambulatory Visit: Payer: Self-pay

## 2021-04-28 ENCOUNTER — Ambulatory Visit (INDEPENDENT_AMBULATORY_CARE_PROVIDER_SITE_OTHER): Payer: 59 | Admitting: Family Medicine

## 2021-04-28 VITALS — BP 146/88 | HR 61 | Temp 97.0°F | Wt 204.4 lb

## 2021-04-28 DIAGNOSIS — L02412 Cutaneous abscess of left axilla: Secondary | ICD-10-CM

## 2021-04-28 NOTE — Progress Notes (Signed)
   Subjective:    Patient ID: Jeffrey Forbes, male    DOB: May 20, 1951, 70 y.o.   MRN: [id number]  HPI He is here for a recheck.  He was seen in an urgent care center and started on Bactrim for treatment of a presumed MRSA infection of his left axilla he has been on the medicine for about 4-1/2 days and is concerned over how well he is doing.   Review of Systems     Objective:   Physical Exam Exam of the left axilla does show a 1 and half centimeter induration with slight erythema.  It was compared to a picture that he has and definitely shows an improvement. No other axillary lesions noted.      Assessment & Plan:  Abscess of axilla, left I explained that I thought it was healing very nicely and to take the rest of the antibiotic.  He will call me if he has further difficulty.

## 2021-05-03 ENCOUNTER — Other Ambulatory Visit: Payer: Self-pay

## 2021-05-03 ENCOUNTER — Ambulatory Visit (INDEPENDENT_AMBULATORY_CARE_PROVIDER_SITE_OTHER): Payer: 59 | Admitting: Family Medicine

## 2021-05-03 VITALS — BP 120/82 | HR 65 | Temp 96.4°F | Wt 202.4 lb

## 2021-05-03 DIAGNOSIS — L989 Disorder of the skin and subcutaneous tissue, unspecified: Secondary | ICD-10-CM

## 2021-05-03 NOTE — Progress Notes (Signed)
   Subjective:    Patient ID: Jeffrey Forbes, male    DOB: Oct 23, 1950, 70 y.o.   MRN: [id number]  HPI He states that he has a 1 day history of a feeling of a pressure in the perianal area and when he palpated the area he did note some slight blood.  He has never had difficulty with that in the past.  He is now finishing up treatment of a left axillary abscess that is being treated as if he had MRSA.  He has no previous history of herpetic type lesions.   Review of Systems     Objective:   Physical Exam Exam of the sacral area does show bilateral vesicular type lesions that are relatively dry.  Anal area is normal.  No induration is noted.       Assessment & Plan:  Skin lesion - Plan: Herpes simplex virus culture Since he is finishing up a course to treat his axillary abscess I doubt this is MRSA related and he does not have any induration in that area.  He has never had any herpes lesions but certainly makes me wonder whether that is the case.

## 2021-05-05 LAB — HERPES SIMPLEX VIRUS CULTURE

## 2021-05-09 ENCOUNTER — Encounter: Payer: Self-pay | Admitting: Family Medicine

## 2021-07-15 ENCOUNTER — Telehealth: Payer: Self-pay | Admitting: Family Medicine

## 2021-07-15 ENCOUNTER — Other Ambulatory Visit: Payer: Self-pay | Admitting: Family Medicine

## 2021-07-15 MED ORDER — VALACYCLOVIR HCL 1 G PO TABS: 1000.0000 mg | ORAL_TABLET | Freq: Two times a day (BID) | ORAL | 0 refills | Status: DC

## 2021-07-15 MED ORDER — VALACYCLOVIR HCL 1 G PO TABS: 1000.0000 mg | ORAL_TABLET | Freq: Two times a day (BID) | ORAL | 0 refills | Status: AC

## 2021-07-15 NOTE — Telephone Encounter (Signed)
Pt called and states that he is having a herpes flair up. Same area as last time in Sept. Pt is requesting medication be set to [facility] on [address] in [place]. Pt can be reached at [phone].

## 2021-09-20 ENCOUNTER — Other Ambulatory Visit: Payer: Self-pay

## 2021-09-20 ENCOUNTER — Ambulatory Visit
Admission: RE | Admit: 2021-09-20 | Discharge: 2021-09-20 | Disposition: A | Discharge status: Home / Self Care | Payer: Self-pay | Source: Ambulatory Visit | Attending: Family Medicine | Admitting: Family Medicine

## 2021-09-20 ENCOUNTER — Ambulatory Visit: Payer: POS | Admitting: Family Medicine

## 2021-09-20 ENCOUNTER — Encounter: Payer: Self-pay | Admitting: Family Medicine

## 2021-09-20 VITALS — BP 138/80 | HR 49 | Temp 97.0°F | Wt 175.4 lb

## 2021-09-20 DIAGNOSIS — M89319 Hypertrophy of bone, unspecified shoulder: Secondary | ICD-10-CM

## 2021-09-20 DIAGNOSIS — L821 Other seborrheic keratosis: Secondary | ICD-10-CM | POA: Diagnosis not present

## 2021-09-20 NOTE — Progress Notes (Signed)
° °  Subjective:    Patient ID: Jeffrey Forbes, male    DOB: [birth year], 71 y.o.   MRN: [id number]  HPI He recently noticed that his right clavicle seems to be enlarged at the [facility] joint.  He has no history of recent injury.  He has lost roughly 20 pounds recently which could play a role in his making this more noticeable. He also wants me to look at his back for any concerning lesions.  Review of Systems     Objective:   Physical Exam Exam of the right clavicle shows enlargement of the [place] joint but is nontender and smooth Back exam shows 1 seborrheic keratosis on the left lower back area otherwise negative.       Assessment & Plan:  Clavicle enlargement - Plan: DG Clavicle Right  Seborrheic keratosis Explained that the seborrheic keratosis is of no great concern.  I will call when the x-rays come in.

## 2021-11-12 ENCOUNTER — Encounter: Payer: Self-pay | Admitting: Family Medicine

## 2021-12-14 ENCOUNTER — Other Ambulatory Visit: Payer: Self-pay | Admitting: Family Medicine

## 2021-12-14 DIAGNOSIS — N401 Enlarged prostate with lower urinary tract symptoms: Secondary | ICD-10-CM

## 2021-12-15 ENCOUNTER — Other Ambulatory Visit: Payer: Self-pay | Admitting: Family Medicine

## 2021-12-15 DIAGNOSIS — I10 Essential (primary) hypertension: Secondary | ICD-10-CM

## 2021-12-15 DIAGNOSIS — N401 Enlarged prostate with lower urinary tract symptoms: Secondary | ICD-10-CM

## 2022-01-10 ENCOUNTER — Ambulatory Visit: Payer: POS | Admitting: Family Medicine

## 2022-01-10 ENCOUNTER — Encounter: Payer: Self-pay | Admitting: Family Medicine

## 2022-01-10 VITALS — BP 122/70 | HR 48 | Temp 97.0°F | Wt 166.0 lb

## 2022-01-10 DIAGNOSIS — D171 Benign lipomatous neoplasm of skin and subcutaneous tissue of trunk: Secondary | ICD-10-CM | POA: Diagnosis not present

## 2022-01-10 NOTE — Progress Notes (Signed)
   Subjective:    Patient ID: Jeffrey Forbes, male    DOB: [birth year], 71 y.o.   MRN: [id number]  HPI He is here for evaluation of a lesion in his left axilla.  He has noticed this and thinks that has been growing over the last month or so.  He has had no fever, chills, cough, congestion, shortness of breath.  Over the last year, he has lost approximately 45 pounds on his own by making diet and exercise changes.  He is very happy with this.   Review of Systems     Objective:   Physical Exam Alert and in no distress, round smooth movable lesion is noted in the left axilla in the area of the lateral pec.       Assessment & Plan:  Lipoma of torso I explained that he is now noticing this because he has lost a lot of weight and this is a benign lesion with no intervention needed.

## 2022-02-16 ENCOUNTER — Encounter: Payer: Self-pay | Admitting: Family Medicine

## 2022-03-09 ENCOUNTER — Encounter: Payer: 59 | Admitting: Family Medicine

## 2022-03-21 ENCOUNTER — Other Ambulatory Visit: Payer: Self-pay | Admitting: Family Medicine

## 2022-03-21 DIAGNOSIS — N401 Enlarged prostate with lower urinary tract symptoms: Secondary | ICD-10-CM

## 2022-03-21 DIAGNOSIS — I10 Essential (primary) hypertension: Secondary | ICD-10-CM

## 2022-04-10 ENCOUNTER — Encounter: Payer: Self-pay | Admitting: Family Medicine

## 2022-04-10 ENCOUNTER — Ambulatory Visit (INDEPENDENT_AMBULATORY_CARE_PROVIDER_SITE_OTHER): Payer: POS | Admitting: Family Medicine

## 2022-04-10 VITALS — BP 112/70 | HR 50 | Temp 96.8°F | Ht 69.0 in | Wt 157.8 lb

## 2022-04-10 DIAGNOSIS — D126 Benign neoplasm of colon, unspecified: Secondary | ICD-10-CM

## 2022-04-10 DIAGNOSIS — I10 Essential (primary) hypertension: Secondary | ICD-10-CM | POA: Diagnosis not present

## 2022-04-10 DIAGNOSIS — K5730 Diverticulosis of large intestine without perforation or abscess without bleeding: Secondary | ICD-10-CM

## 2022-04-10 DIAGNOSIS — J309 Allergic rhinitis, unspecified: Secondary | ICD-10-CM

## 2022-04-10 DIAGNOSIS — G4733 Obstructive sleep apnea (adult) (pediatric): Secondary | ICD-10-CM

## 2022-04-10 DIAGNOSIS — N401 Enlarged prostate with lower urinary tract symptoms: Secondary | ICD-10-CM | POA: Diagnosis not present

## 2022-04-10 DIAGNOSIS — Z0000 Encounter for general adult medical examination without abnormal findings: Secondary | ICD-10-CM

## 2022-04-10 DIAGNOSIS — Z13220 Encounter for screening for lipoid disorders: Secondary | ICD-10-CM

## 2022-04-10 DIAGNOSIS — Z96652 Presence of left artificial knee joint: Secondary | ICD-10-CM

## 2022-04-10 MED ORDER — TAMSULOSIN HCL 0.4 MG PO CAPS: 0.4000 mg | ORAL_CAPSULE | Freq: Every day | ORAL | 3 refills | Status: DC

## 2022-04-10 MED ORDER — LOSARTAN POTASSIUM-HCTZ 100-12.5 MG PO TABS: 1.0000 | ORAL_TABLET | Freq: Every day | ORAL | 3 refills | Status: DC

## 2022-04-10 MED ORDER — FINASTERIDE 5 MG PO TABS: 5.0000 mg | ORAL_TABLET | Freq: Every day | ORAL | 3 refills | Status: DC

## 2022-04-10 NOTE — Progress Notes (Signed)
Complete physical exam  Patient: Jeffrey Forbes   DOB: September 22, 1950   71 y.o. Male  MRN: [id number]  Subjective:    Chief Complaint  Patient presents with   Annual Exam    Fasting     Jeffrey Forbes is a 70 y.o. male who presents today for a complete physical exam. He reports consuming a low sodium and 1400-01500 calories  diet. The patient has a physically strenuous job, but has no regular exercise apart from work.  He generally feels well. He reports sleeping well. He has been on a weight loss program using [employer] which she finds quite useful.  He is very happy with this and is lost roughly 50 pounds.  His waist is now 32 inches.  He has OSA and is on a CPAP.  He is taking blood pressure medicine and is interested in potentially coming off the medication.  He has a history of colonic polyp and should be scheduled for follow-up next year.  Continues on finasteride and Flomax and is having no difficulty with that.  His allergies seem to be under good control.  He is having no difficulty with his TKR.  Otherwise his family and social history as well as health maintenance and immunizations was reviewed   Most recent fall risk assessment:    09/20/2021    3:21 PM  [place] in the past year? 0  Number falls in past yr: 0  Injury with Fall? 0  Risk for fall due to : No Fall Risks  Follow up Falls evaluation completed     Most recent depression screenings:    04/10/2022   11:31 AM 09/20/2021    3:21 PM  PHQ 2/9 Scores  PHQ - 2 Score 0 0      Patient Active Problem List   Diagnosis Date Noted   Seborrheic keratosis 09/20/2021   Adenomatous polyp of colon 03/08/2021   Essential hypertension 07/04/2019   S/P TKR (total knee replacement), left 07/04/2019   Benign prostatic hyperplasia with lower urinary tract symptoms 06/26/2018   Diverticulosis of sigmoid colon 04/11/2018   Obstructive sleep apnea 05/29/2013   Allergic rhinitis, mild 09/25/2011   Past Medical History:   Diagnosis Date   Allergic rhinitis    Hemorrhoids    Hypertension    Personal history of colonic polyps    Sleep apnea    Past Surgical History:  Procedure Laterality Date   COLONOSCOPY  2008   Dr. Benson [place]   HERNIA REPAIR     LASIK     Social History   Tobacco Use   Smoking status: Never   Smokeless tobacco: Never  Substance Use Topics   Alcohol use: Yes    Comment: occasional (1-2/month)   Drug use: No   Family History  Adopted: Yes   Allergies  Allergen Reactions   Atorvastatin Diarrhea      Patient Care Team: Denita Lung, MD as PCP - General (Family Medicine)   Outpatient Medications Prior to Visit  Medication Sig Note   aspirin 81 MG tablet Take 160 mg by mouth daily.     cetirizine (ZYRTEC) 10 MG tablet Take 10 mg by mouth daily.    finasteride (PROSCAR) 5 MG tablet TAKE 1 TABLET (5 MG TOTAL) BY MOUTH DAILY.    fish oil-omega-3 fatty acids 1000 MG capsule Take 2 g by mouth daily.    fluticasone (FLONASE) 50 MCG/ACT nasal spray Place into the nose. 04/10/2022: Prn last dose this  am   losartan-hydrochlorothiazide (HYZAAR) 100-12.5 MG tablet TAKE 1 TABLET BY MOUTH EVERY DAY    Multiple Vitamins-Minerals (MULTIVITAMIN WITH MINERALS) tablet Take 1 tablet by mouth daily.     tamsulosin (FLOMAX) 0.4 MG CAPS capsule TAKE 1 CAPSULE BY MOUTH EVERY DAY    [DISCONTINUED] metoprolol succinate (TOPROL-XL) 100 MG 24 hr tablet TAKE 1 TABLET BY MOUTH DAILY. TAKE WITH OR IMMEDIATELY FOLLOWING A MEAL.    [DISCONTINUED] diclofenac (VOLTAREN) 75 MG EC tablet Take by mouth. (Patient not taking: Reported on 11/04/2020)    [DISCONTINUED] mupirocin ointment (BACTROBAN) 2 %  (Patient not taking: Reported on 04/05/2020)    [DISCONTINUED] potassium chloride (MICRO-K) 10 MEQ CR capsule Take 10 mEq by mouth 2 (two) times daily. (Patient not taking: Reported on 04/05/2020)    [DISCONTINUED] sulfamethoxazole-trimethoprim (BACTRIM DS) 800-160 MG tablet Take 1 tablet by mouth 2 (two) times daily.  (Patient not taking: Reported on 09/20/2021)    No facility-administered medications prior to visit.    Review of Systems  All other systems reviewed and are negative.         Objective:     BP 112/70   Pulse (!) 50   Temp (!) 96.8 F (36 C)   Ht '5\' 9"'  (1.753 m)   Wt 157 lb 12.8 oz (71.6 kg)   SpO2 95%   BMI 23.30 kg/m  BP Readings from Last 3 Encounters:  04/10/22 112/70  01/10/22 122/70  09/20/21 138/80   Wt Readings from Last 3 Encounters:  04/10/22 157 lb 12.8 oz (71.6 kg)  01/10/22 166 lb (75.3 kg)  09/20/21 175 lb 6.4 oz (79.6 kg)       The 10-year ASCVD risk score (Arnett DK, et al., 2019) is: 19.4%   Values used to calculate the score:     Age: 27 years     Sex: Male     Is Non-Hispanic African American: No     Diabetic: No     Tobacco smoker: No     Systolic Blood Pressure: 258 mmHg     Is BP treated: Yes     HDL Cholesterol: 36 mg/dL     Total Cholesterol: 173 mg/dL   Physical Exam  Alert and in no distress. Tympanic membranes and canals are normal. Pharyngeal area is normal. Neck is supple without adenopathy or thyromegaly. Cardiac exam shows a regular sinus rhythm without murmurs or gallops. Lungs are clear to auscultation.  Last CBC Lab Results  Component Value Date   WBC 6.0 11/04/2020   HGB 14.9 11/04/2020   HCT 43.5 11/04/2020   MCV 92 11/04/2020   MCH 31.6 11/04/2020   RDW 12.6 11/04/2020   PLT 166 52/77/8242   Last metabolic panel Lab Results  Component Value Date   GLUCOSE 108 (H) 11/04/2020   NA 144 11/04/2020   K 3.2 (L) 11/04/2020   CL 102 11/04/2020   CO2 27 11/04/2020   BUN 15 11/04/2020   CREATININE 0.89 11/04/2020   EGFR 93 11/04/2020   CALCIUM 9.0 11/04/2020   PROT 6.5 11/04/2020   ALBUMIN 4.4 11/04/2020   LABGLOB 2.1 11/04/2020   AGRATIO 2.1 11/04/2020   BILITOT 0.5 11/04/2020   ALKPHOS 91 11/04/2020   AST 20 11/04/2020   ALT 18 11/04/2020   Last lipids Lab Results  Component Value Date   CHOL 173  11/04/2020   HDL 36 (L) 11/04/2020   LDLCALC 88 11/04/2020   TRIG 296 (H) 11/04/2020   CHOLHDL 4.8 11/04/2020  Assessment & Plan:    Routine general medical examination at a health care facility  Essential hypertension - Plan: CBC with Differential/Platelet, Comprehensive metabolic panel  Allergic rhinitis, mild  Obstructive sleep apnea  Benign prostatic hyperplasia with lower urinary tract symptoms, symptom details unspecified  S/P TKR (total knee replacement), left  Screening for lipid disorders - Plan: Lipid panel  Diverticulosis of sigmoid colon  Immunization History  Administered Date(s) Administered   DT (Pediatric) 08/15/1996   DTP 08/15/1996   DTaP 07/26/2006   Fluad Quad(high Dose 65+) 04/22/2021   Influenza Whole 05/07/2013   Influenza, High Dose Seasonal PF 05/28/2017, 06/26/2018, 06/30/2019, 05/21/2020, 04/22/2021   Influenza,inj,Quad PF,6+ Mos 09/17/2014   Influenza-Unspecified 09/17/2014, 05/28/2017, 06/26/2018, 06/30/2019   PFIZER Comirnaty(Gray Top)Covid-19 Tri-Sucrose Vaccine 10/27/2019, 11/17/2019, 05/21/2020   PFIZER(Purple Top)SARS-COV-2 Vaccination 10/27/2019, 11/17/2019, 05/21/2020   Pneumococcal Conjugate-13 06/12/2017   Pneumococcal Polysaccharide-23 06/26/2018   Tdap 01/14/2014   Zoster Recombinat (Shingrix) 05/28/2017, 08/02/2017   Zoster, Live 06/24/2010, 05/28/2017, 08/02/2017   Zoster, Unspecified 05/28/2017, 08/02/2017    Health Maintenance  Topic Date Due   COVID-19 Vaccine (7 - Pfizer risk series) 07/16/2020   INFLUENZA VACCINE  03/14/2022   TETANUS/TDAP  01/15/2024   COLONOSCOPY (Pts 45-70yr Insurance coverage will need to be confirmed)  04/04/2028   Pneumonia Vaccine 70 Years old  Completed   Hepatitis C Screening  Completed   Zoster Vaccines- Shingrix  Completed   HPV VACCINES  Aged Out  I will have him stop the Toprol.  He is to return here in 1 month and bring a blood pressure cuff with him and will measure it  against ours.  His cardiovascular risk is elevated however I will wait for the latest lipid panel and discuss this later.  Discussed possibly reevaluating the need for CPAP.  He would like to discuss this at a later date.  Continue on his BPH meds as well as his allergy medications.  Discussed health benefits of physical activity, and encouraged him to engage in regular exercise appropriate for his age and condition.  Problem List Items Addressed This Visit     Allergic rhinitis, mild   Benign prostatic hyperplasia with lower urinary tract symptoms   Diverticulosis of sigmoid colon   Essential hypertension   Relevant Orders   CBC with Differential/Platelet   Comprehensive metabolic panel   Obstructive sleep apnea   S/P TKR (total knee replacement), left   Other Visit Diagnoses     Routine general medical examination at a health care facility    -  Primary   Screening for lipid disorders       Relevant Orders   Lipid panel      Return in about 1 year (around 04/11/2023) for cpe . 1 month for blood pressure recheck.    JJill Alexanders MD

## 2022-04-10 NOTE — Patient Instructions (Signed)
Health Maintenance, Male Adopting a healthy lifestyle and getting preventive care are important in promoting health and wellness. Ask your health care provider about: The right schedule for you to have regular tests and exams. Things you can do on your own to prevent diseases and keep yourself healthy. What should I know about diet, weight, and exercise? Eat a healthy diet  Eat a diet that includes plenty of vegetables, fruits, low-fat dairy products, and lean protein. Do not eat a lot of foods that are high in solid fats, added sugars, or sodium. Maintain a healthy weight Body mass index (BMI) is a measurement that can be used to identify possible weight problems. It estimates body fat based on height and weight. Your health care provider can help determine your BMI and help you achieve or maintain a healthy weight. Get regular exercise Get regular exercise. This is one of the most important things you can do for your health. Most adults should: Exercise for at least 150 minutes each week. The exercise should increase your heart rate and make you sweat (moderate-intensity exercise). Do strengthening exercises at least twice a week. This is in addition to the moderate-intensity exercise. Spend less time sitting. Even light physical activity can be beneficial. Watch cholesterol and blood lipids Have your blood tested for lipids and cholesterol at 70 years of age, then have this test every 5 years. You may need to have your cholesterol levels checked more often if: Your lipid or cholesterol levels are high. You are older than 70 years of age. You are at high risk for heart disease. What should I know about cancer screening? Many types of cancers can be detected early and may often be prevented. Depending on your health history and family history, you may need to have cancer screening at various ages. This may include screening for: Colorectal cancer. Prostate cancer. Skin cancer. Lung  cancer. What should I know about heart disease, diabetes, and high blood pressure? Blood pressure and heart disease High blood pressure causes heart disease and increases the risk of stroke. This is more likely to develop in people who have high blood pressure readings or are overweight. Talk with your health care provider about your target blood pressure readings. Have your blood pressure checked: Every 3-5 years if you are 18-39 years of age. Every year if you are 40 years old or older. If you are between the ages of 65 and 75 and are a current or former smoker, ask your health care provider if you should have a one-time screening for abdominal aortic aneurysm (AAA). Diabetes Have regular diabetes screenings. This checks your fasting blood sugar level. Have the screening done: Once every three years after age 45 if you are at a normal weight and have a low risk for diabetes. More often and at a younger age if you are overweight or have a high risk for diabetes. What should I know about preventing infection? Hepatitis B If you have a higher risk for hepatitis B, you should be screened for this virus. Talk with your health care provider to find out if you are at risk for hepatitis B infection. Hepatitis C Blood testing is recommended for: Everyone born from 1945 through 1965. Anyone with known risk factors for hepatitis C. Sexually transmitted infections (STIs) You should be screened each year for STIs, including gonorrhea and chlamydia, if: You are sexually active and are younger than 70 years of age. You are older than 70 years of age and your   health care provider tells you that you are at risk for this type of infection. Your sexual activity has changed since you were last screened, and you are at increased risk for chlamydia or gonorrhea. Ask your health care provider if you are at risk. Ask your health care provider about whether you are at high risk for HIV. Your health care provider  may recommend a prescription medicine to help prevent HIV infection. If you choose to take medicine to prevent HIV, you should first get tested for HIV. You should then be tested every 3 months for as long as you are taking the medicine. Follow these instructions at home: Alcohol use Do not drink alcohol if your health care provider tells you not to drink. If you drink alcohol: Limit how much you have to 0-2 drinks a day. Know how much alcohol is in your drink. In the [place] one drink equals one 12 oz bottle of beer (355 mL), one 5 oz glass of wine (148 mL), or one 1 oz glass of hard liquor (44 mL). Lifestyle Do not use any products that contain nicotine or tobacco. These products include cigarettes, chewing tobacco, and vaping devices, such as e-cigarettes. If you need help quitting, ask your health care provider. Do not use street drugs. Do not share needles. Ask your health care provider for help if you need support or information about quitting drugs. General instructions Schedule regular health, dental, and eye exams. Stay current with your vaccines. Tell your health care provider if: You often feel depressed. You have ever been abused or do not feel safe at home. Summary Adopting a healthy lifestyle and getting preventive care are important in promoting health and wellness. Follow your health care provider's instructions about healthy diet, exercising, and getting tested or screened for diseases. Follow your health care provider's instructions on monitoring your cholesterol and blood pressure. This information is not intended to replace advice given to you by your health care provider. Make sure you discuss any questions you have with your health care provider. Document Revised: 12/20/2020 Document Reviewed: 12/20/2020 Elsevier Patient Education  2023 Elsevier Inc.  

## 2022-04-11 LAB — CBC WITH DIFFERENTIAL/PLATELET
Basophils Absolute: 0 10*3/uL (ref 0.0–0.2)
Basos: 1 %
EOS (ABSOLUTE): 0.1 10*3/uL (ref 0.0–0.4)
Eos: 3 %
Hematocrit: 39.9 % (ref 37.5–51.0)
Hemoglobin: 13.3 g/dL (ref 13.0–17.7)
Immature Grans (Abs): 0 10*3/uL (ref 0.0–0.1)
Immature Granulocytes: 0 %
Lymphocytes Absolute: 1.5 10*3/uL (ref 0.7–3.1)
Lymphs: 36 %
MCH: 32.1 pg (ref 26.6–33.0)
MCHC: 33.3 g/dL (ref 31.5–35.7)
MCV: 96 fL (ref 79–97)
Monocytes Absolute: 0.3 10*3/uL (ref 0.1–0.9)
Monocytes: 8 %
Neutrophils Absolute: 2.2 10*3/uL (ref 1.4–7.0)
Neutrophils: 52 %
Platelets: 143 10*3/uL — ABNORMAL LOW (ref 150–450)
RBC: 4.14 x10E6/uL (ref 4.14–5.80)
RDW: 12 % (ref 11.6–15.4)
WBC: 4.1 10*3/uL (ref 3.4–10.8)

## 2022-04-11 LAB — LIPID PANEL
Chol/HDL Ratio: 2.9 ratio (ref 0.0–5.0)
Cholesterol, Total: 128 mg/dL (ref 100–199)
HDL: 44 mg/dL (ref >39)
LDL Chol Calc (NIH): 70 mg/dL (ref 0–99)
Triglycerides: 68 mg/dL (ref 0–149)
VLDL Cholesterol Cal: 14 mg/dL (ref 5–40)

## 2022-04-11 LAB — COMPREHENSIVE METABOLIC PANEL
ALT: 21 IU/L (ref 0–44)
AST: 20 IU/L (ref 0–40)
Albumin/Globulin Ratio: 2.4 — ABNORMAL HIGH (ref 1.2–2.2)
Albumin: 4.6 g/dL (ref 3.9–4.9)
Alkaline Phosphatase: 88 IU/L (ref 44–121)
BUN/Creatinine Ratio: 12 (ref 10–24)
BUN: 11 mg/dL (ref 8–27)
Bilirubin Total: 0.8 mg/dL (ref 0.0–1.2)
CO2: 26 mmol/L (ref 20–29)
Calcium: 8.9 mg/dL (ref 8.6–10.2)
Chloride: 100 mmol/L (ref 96–106)
Creatinine, Ser: 0.91 mg/dL (ref 0.76–1.27)
Globulin, Total: 1.9 g/dL (ref 1.5–4.5)
Glucose: 94 mg/dL (ref 70–99)
Potassium: 3.5 mmol/L (ref 3.5–5.2)
Sodium: 145 mmol/L — ABNORMAL HIGH (ref 134–144)
Total Protein: 6.5 g/dL (ref 6.0–8.5)
eGFR: 91 mL/min/{1.73_m2} (ref >59)

## 2022-04-19 ENCOUNTER — Encounter: Payer: Self-pay | Admitting: Internal Medicine

## 2022-05-15 ENCOUNTER — Encounter: Payer: Self-pay | Admitting: Family Medicine

## 2022-05-15 ENCOUNTER — Ambulatory Visit: Payer: POS | Admitting: Family Medicine

## 2022-05-15 VITALS — BP 119/71 | HR 62 | Temp 96.8°F | Wt 157.2 lb

## 2022-05-15 DIAGNOSIS — I10 Essential (primary) hypertension: Secondary | ICD-10-CM | POA: Diagnosis not present

## 2022-05-15 DIAGNOSIS — Z23 Encounter for immunization: Secondary | ICD-10-CM

## 2022-05-15 NOTE — Patient Instructions (Signed)
Hypertension, Adult High blood pressure (hypertension) is when the force of blood pumping through the arteries is too strong. The arteries are the blood vessels that carry blood from the heart throughout the body. Hypertension forces the heart to work harder to pump blood and may cause arteries to become narrow or stiff. Untreated or uncontrolled hypertension can lead to a heart attack, heart failure, a stroke, kidney disease, and other problems. A blood pressure reading consists of a higher number over a lower number. Ideally, your blood pressure should be below 120/80. The first ("top") number is called the systolic pressure. It is a measure of the pressure in your arteries as your heart beats. The second ("bottom") number is called the diastolic pressure. It is a measure of the pressure in your arteries as the heart relaxes. What are the causes? The exact cause of this condition is not known. There are some conditions that result in high blood pressure. What increases the risk? Certain factors may make you more likely to develop high blood pressure. Some of these risk factors are under your control, including: Smoking. Not getting enough exercise or physical activity. Being overweight. Having too much fat, sugar, calories, or salt (sodium) in your diet. Drinking too much alcohol. Other risk factors include: Having a personal history of heart disease, diabetes, high cholesterol, or kidney disease. Stress. Having a family history of high blood pressure and high cholesterol. Having obstructive sleep apnea. Age. The risk increases with age. What are the signs or symptoms? High blood pressure may not cause symptoms. Very high blood pressure (hypertensive crisis) may cause: Headache. Fast or irregular heartbeats (palpitations). Shortness of breath. Nosebleed. Nausea and vomiting. Vision changes. Severe chest pain, dizziness, and seizures. How is this diagnosed? This condition is diagnosed by  measuring your blood pressure while you are seated, with your arm resting on a flat surface, your legs uncrossed, and your feet flat on the floor. The cuff of the blood pressure monitor will be placed directly against the skin of your upper arm at the level of your heart. Blood pressure should be measured at least twice using the same arm. Certain conditions can cause a difference in blood pressure between your right and left arms. If you have a high blood pressure reading during one visit or you have normal blood pressure with other risk factors, you may be asked to: Return on a different day to have your blood pressure checked again. Monitor your blood pressure at home for 1 week or longer. If you are diagnosed with hypertension, you may have other blood or imaging tests to help your health care provider understand your overall risk for other conditions. How is this treated? This condition is treated by making healthy lifestyle changes, such as eating healthy foods, exercising more, and reducing your alcohol intake. You may be referred for counseling on a healthy diet and physical activity. Your health care provider may prescribe medicine if lifestyle changes are not enough to get your blood pressure under control and if: Your systolic blood pressure is above 130. Your diastolic blood pressure is above 80. Your personal target blood pressure may vary depending on your medical conditions, your age, and other factors. Follow these instructions at home: Eating and drinking  Eat a diet that is high in fiber and potassium, and low in sodium, added sugar, and fat. An example of this eating plan is called the DASH diet. DASH stands for Dietary Approaches to Stop Hypertension. To eat this way: Eat   plenty of fresh fruits and vegetables. Try to fill one half of your plate at each meal with fruits and vegetables. Eat whole grains, such as whole-wheat pasta, brown rice, or whole-grain bread. Fill about one  fourth of your plate with whole grains. Eat or drink low-fat dairy products, such as skim milk or low-fat yogurt. Avoid fatty cuts of meat, processed or cured meats, and poultry with skin. Fill about one fourth of your plate with lean proteins, such as fish, chicken without skin, beans, eggs, or tofu. Avoid pre-made and processed foods. These tend to be higher in sodium, added sugar, and fat. Reduce your daily sodium intake. Many people with hypertension should eat less than 1,500 mg of sodium a day. Do not drink alcohol if: Your health care provider tells you not to drink. You are pregnant, may be pregnant, or are planning to become pregnant. If you drink alcohol: Limit how much you have to: 0-1 drink a day for women. 0-2 drinks a day for men. Know how much alcohol is in your drink. In the [place] one drink equals one 12 oz bottle of beer (355 mL), one 5 oz glass of wine (148 mL), or one 1 oz glass of hard liquor (44 mL). Lifestyle  Work with your health care provider to maintain a healthy body weight or to lose weight. Ask what an ideal weight is for you. Get at least 30 minutes of exercise that causes your heart to beat faster (aerobic exercise) most days of the week. Activities may include walking, swimming, or biking. Include exercise to strengthen your muscles (resistance exercise), such as Pilates or lifting weights, as part of your weekly exercise routine. Try to do these types of exercises for 30 minutes at least 3 days a week. Do not use any products that contain nicotine or tobacco. These products include cigarettes, chewing tobacco, and vaping devices, such as e-cigarettes. If you need help quitting, ask your health care provider. Monitor your blood pressure at home as told by your health care provider. Keep all follow-up visits. This is important. Medicines Take over-the-counter and prescription medicines only as told by your health care provider. Follow directions carefully. Blood  pressure medicines must be taken as prescribed. Do not skip doses of blood pressure medicine. Doing this puts you at risk for problems and can make the medicine less effective. Ask your health care provider about side effects or reactions to medicines that you should watch for. Contact a health care provider if you: Think you are having a reaction to a medicine you are taking. Have headaches that keep coming back (recurring). Feel dizzy. Have swelling in your ankles. Have trouble with your vision. Get help right away if you: Develop a severe headache or confusion. Have unusual weakness or numbness. Feel faint. Have severe pain in your chest or abdomen. Vomit repeatedly. Have trouble breathing. These symptoms may be an emergency. Get help right away. Call 911. Do not wait to see if the symptoms will go away. Do not drive yourself to the hospital. Summary Hypertension is when the force of blood pumping through your arteries is too strong. If this condition is not controlled, it may put you at risk for serious complications. Your personal target blood pressure may vary depending on your medical conditions, your age, and other factors. For most people, a normal blood pressure is less than 120/80. Hypertension is treated with lifestyle changes, medicines, or a combination of both. Lifestyle changes include losing weight, eating a healthy,   low-sodium diet, exercising more, and limiting alcohol. This information is not intended to replace advice given to you by your health care provider. Make sure you discuss any questions you have with your health care provider. Document Revised: 06/07/2021 Document Reviewed: 06/07/2021 Elsevier Patient Education  2023 Elsevier Inc.  

## 2022-05-15 NOTE — Progress Notes (Signed)
   Subjective:    Patient ID: Jeffrey Forbes, male    DOB: 1950/10/22, 71 y.o.   MRN: [id number]  HPI He is here for a recheck.  He did stop taking metoprolol mainly due to weight loss.  He has been using the Noom method and is very happy with this.  He did bring his blood pressure cuff in with him today.   Review of Systems     Objective:   Physical Exam Alert and in no distress.  Blood pressure is recorded.  His cuff is accurate.       Assessment & Plan:  Essential hypertension  Need for influenza vaccination - Plan: Flu Vaccine QUAD High Dose(Fluad) He is instructed on proper use of the blood pressure cuff and he will continue on his present medication regimen.  Recommend he check it

## 2022-05-19 ENCOUNTER — Encounter: Payer: Self-pay | Admitting: Family Medicine

## 2022-05-19 ENCOUNTER — Ambulatory Visit: Payer: POS | Admitting: Family Medicine

## 2022-05-19 VITALS — BP 128/72 | HR 60 | Temp 97.6°F | Ht 70.0 in | Wt 158.4 lb

## 2022-05-19 DIAGNOSIS — A4902 Methicillin resistant Staphylococcus aureus infection, unspecified site: Secondary | ICD-10-CM

## 2022-05-19 MED ORDER — DOXYCYCLINE HYCLATE 100 MG PO TABS: 100.0000 mg | ORAL_TABLET | Freq: Two times a day (BID) | ORAL | 0 refills | Status: DC

## 2022-05-19 NOTE — Progress Notes (Signed)
Chief Complaint  Patient presents with   Facial Swelling    Pain and swelling in left nostril. Went to UC yesterday and was rx'd muprirocin ointment. This am when he woke up pain and swelling was worse under his eye and on the left side of his face. Left side of throat now also hurts. He had MRSA in 2012 on the left side of his face and he was hospitalized. Wants to make sure he catches this if this is the same thing.    Over the last couple of days he noted some inflammation on the left side of his nose (felt like a zit on the inside of the nose, like when a teenager)--felt further up, unable to see anything. He went to [facility] yesterday, and has been using mupirocin twice daily.  He noted some blood on the swab this morning after applying the medication. He is having increased pain, and the area on his left nose feels harder.  It hurts where his glasses sit on the nose.  Notes some increased swelling, and also feels harder/swollen/tender at the left cheek.  L eye is watering more. Notes a tender/sensitive spot on the left jaw that is also new.  H/o severe MRSA infection in the past as reported above.  PMH, PSH, SH reviewed  Outpatient Encounter Medications as of 05/19/2022  Medication Sig Note   aspirin 81 MG tablet Take 160 mg by mouth daily.     cetirizine (ZYRTEC) 10 MG tablet Take 10 mg by mouth daily.    finasteride (PROSCAR) 5 MG tablet Take 1 tablet (5 mg total) by mouth daily.    fish oil-omega-3 fatty acids 1000 MG capsule Take 2 g by mouth daily.    losartan-hydrochlorothiazide (HYZAAR) 100-12.5 MG tablet Take 1 tablet by mouth daily.    Multiple Vitamins-Minerals (MULTIVITAMIN WITH MINERALS) tablet Take 1 tablet by mouth daily.     mupirocin ointment (BACTROBAN) 2 % SMARTSIG:1 Application Topical 2-3 Times Daily    tamsulosin (FLOMAX) 0.4 MG CAPS capsule Take 1 capsule (0.4 mg total) by mouth daily.    fluticasone (FLONASE) 50 MCG/ACT nasal spray Place into the nose. (Patient not  taking: Reported on 05/19/2022) 05/19/2022: Has not used in a few days   No facility-administered encounter medications on file as of 05/19/2022.   Allergies  Allergen Reactions   Atorvastatin Diarrhea   ROS:  no fever, chills, URI symptoms.  Slight PND and hoarseness today, with dry cough. No n/v/d, other skin concerns, no chest pain, shortness of breath.   PHYSICAL EXAM:  BP 128/72   Pulse 60   Temp 97.6 F (36.4 C) (Tympanic)   Ht '5\' 10"'$  (1.778 m)   Wt 158 lb 6.4 oz (71.8 kg)   BMI 22.73 kg/m   Well-appearing, pleasant male in no distress HEENT: conjunctiva and sclera are clear.  There is some mild STS noted to the left side of the nose, without any overlying erythema.  Very slight/minimal swelling noted lateral to the left nasolabial fold. This is slightly indurated and tender, as is the L side of the nose. No erythema or warmth. Nasal mucosa mildly edematous, with white mucus and some crusting bilaterally.  No erythema, evidence of bleeding or purulence noted on the left. OP is clear Neck: 1 shotty, tender lymph node on the left, otherwise normal. Heart: regular rate and rhythm Lungs: clear bilaterally Neuro: alert and oriented, cranial nerves intact, normal gait Psych: normal mood, affect, hygiene and grooming   ASSESSMENT/PLAN:  MRSA infection - Plan: doxycycline (VIBRA-TABS) 100 MG tablet  Suspect possible MRSA of nose, spreading to soft tissues. Change to oral ABX. F/u with ENT as planned, if not improving.    If you start having fever, redness and warmth to the skin, or breathing issues (from nose or throat), severe headaches, vision changes, these would be indications for seeking out re-evaluation sooner rather than later

## 2022-05-19 NOTE — Patient Instructions (Signed)
  If you start having fever, redness and warmth to the skin, or breathing issues (from nose or throat), severe headaches, vision changes, these would be indications for seeking out re-evaluation sooner rather than later

## 2022-05-23 ENCOUNTER — Encounter: Payer: Self-pay | Admitting: Internal Medicine

## 2022-06-05 ENCOUNTER — Encounter: Payer: Self-pay | Admitting: Internal Medicine

## 2022-06-15 ENCOUNTER — Other Ambulatory Visit: Payer: Self-pay | Admitting: Family Medicine

## 2022-06-15 DIAGNOSIS — I10 Essential (primary) hypertension: Secondary | ICD-10-CM

## 2022-06-15 NOTE — Telephone Encounter (Signed)
No longer on medication.

## 2022-09-19 ENCOUNTER — Encounter (INDEPENDENT_AMBULATORY_CARE_PROVIDER_SITE_OTHER): Payer: Medicare Other | Admitting: Ophthalmology

## 2022-09-19 DIAGNOSIS — D3131 Benign neoplasm of right choroid: Secondary | ICD-10-CM | POA: Diagnosis not present

## 2022-09-19 DIAGNOSIS — H4423 Degenerative myopia, bilateral: Secondary | ICD-10-CM

## 2022-09-19 DIAGNOSIS — H59029 Cataract (lens) fragments in eye following cataract surgery, unspecified eye: Secondary | ICD-10-CM | POA: Diagnosis not present

## 2022-09-19 DIAGNOSIS — H43813 Vitreous degeneration, bilateral: Secondary | ICD-10-CM | POA: Diagnosis not present

## 2022-09-22 IMAGING — CR DG CLAVICLE*R*
2 series · 2 of 2 positions shown · non-contrast
Comparison: None.

CLINICAL DATA: Enlarged sternoclavicular joint.

EXAM:
RIGHT CLAVICLE - 2+ VIEWS

[w clavicle ap right *]
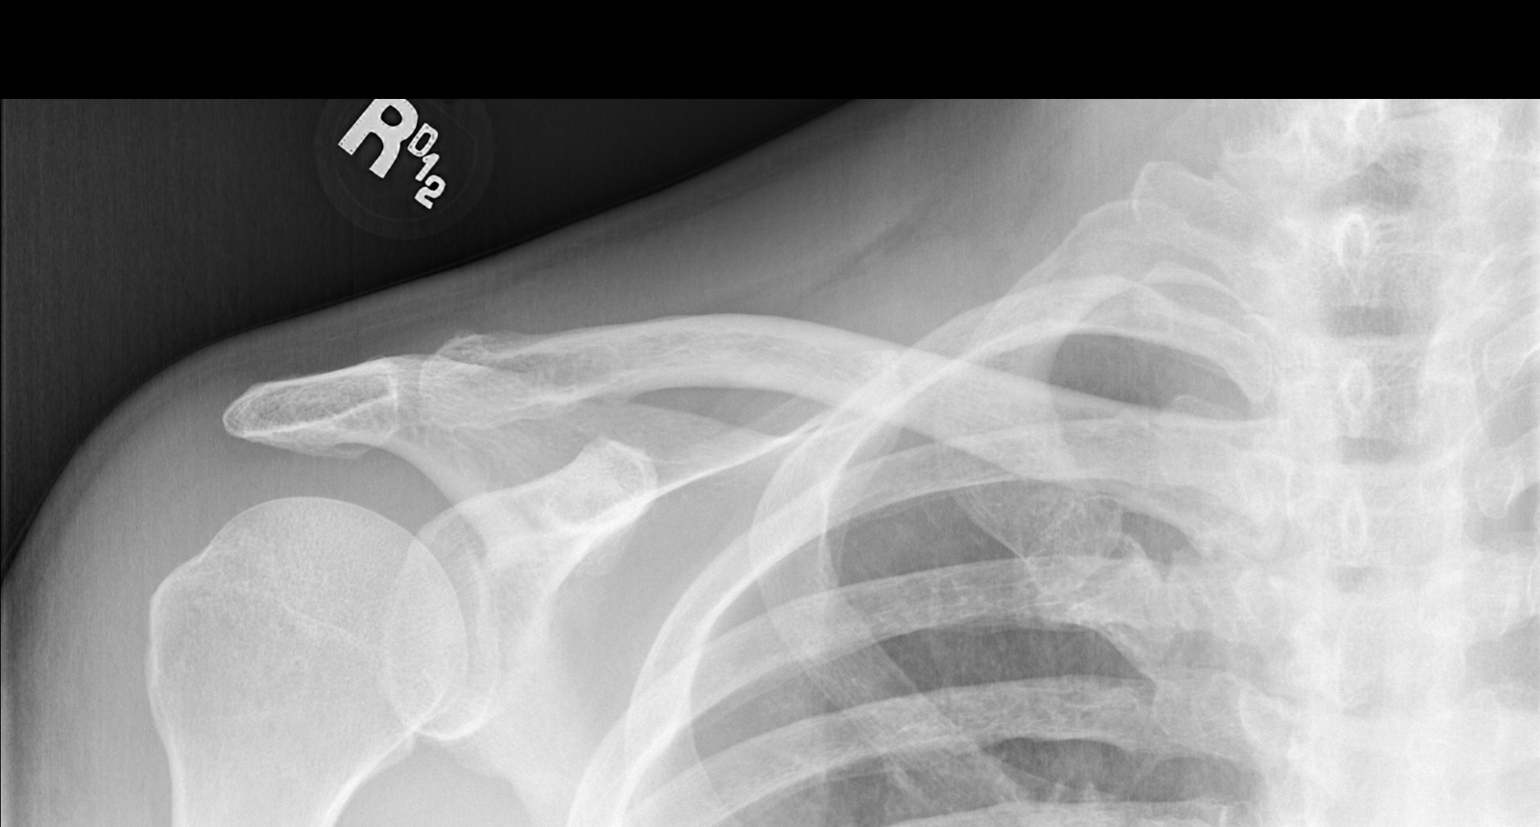

[w clavicle tangential right *]
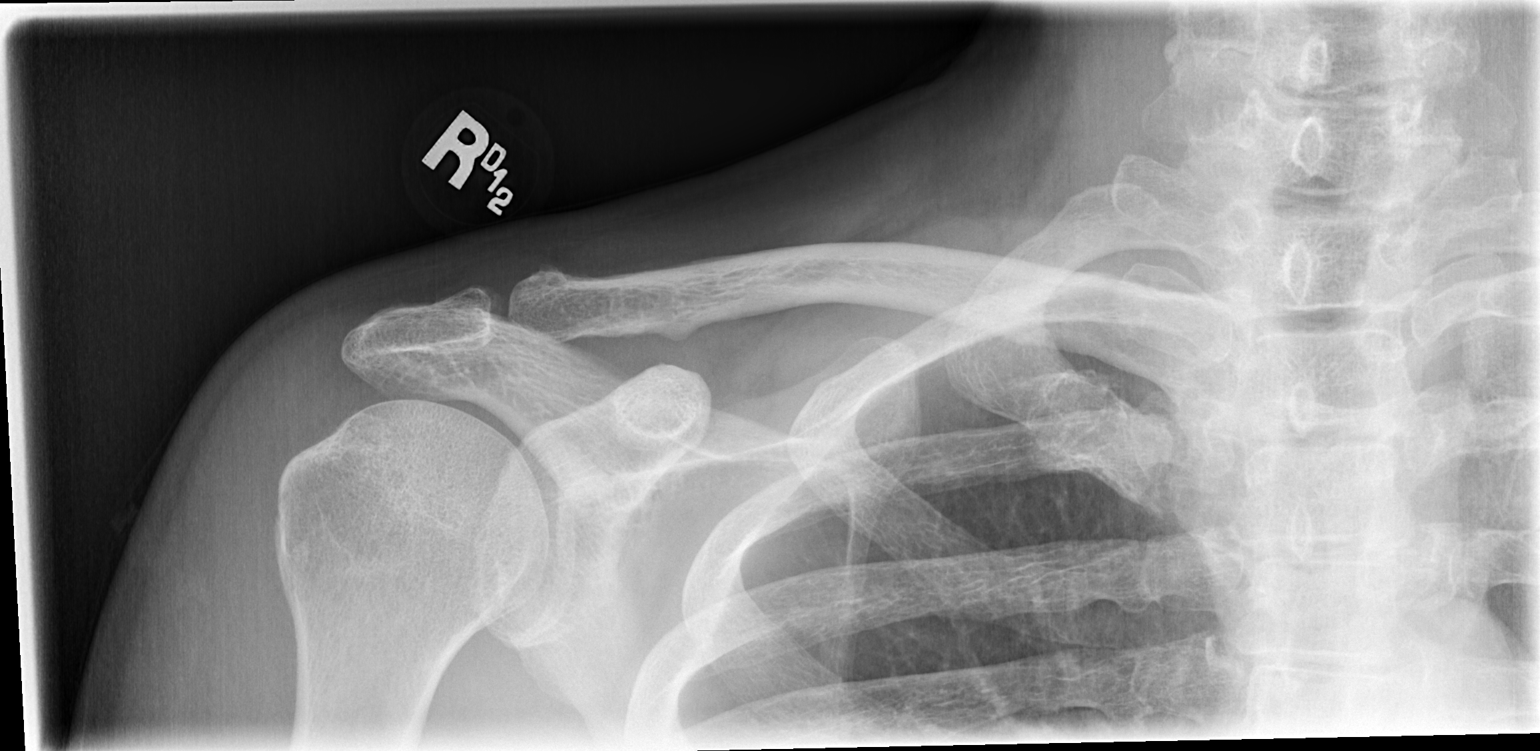

[2 of 2 positions shown; findings below may reference images not displayed]

FINDINGS: No fracture or dislocation is seen. Small bony spurs seen in the
right AC joint. There is no definite offset in alignment in the
sternoclavicular joint. If there is continued clinical suspicion for
injury or degenerative changes in the sternoclavicular joint,
follow-up CT may be considered.
IMPRESSION: No significant radiographic abnormality is seen in the right
clavicle.

## 2022-10-09 ENCOUNTER — Encounter (INDEPENDENT_AMBULATORY_CARE_PROVIDER_SITE_OTHER): Payer: Medicare Other | Admitting: Ophthalmology

## 2022-10-09 DIAGNOSIS — D3131 Benign neoplasm of right choroid: Secondary | ICD-10-CM | POA: Diagnosis not present

## 2022-10-09 DIAGNOSIS — H59029 Cataract (lens) fragments in eye following cataract surgery, unspecified eye: Secondary | ICD-10-CM | POA: Diagnosis not present

## 2022-10-09 DIAGNOSIS — H4423 Degenerative myopia, bilateral: Secondary | ICD-10-CM

## 2022-10-09 DIAGNOSIS — H43813 Vitreous degeneration, bilateral: Secondary | ICD-10-CM

## 2022-10-30 ENCOUNTER — Encounter (INDEPENDENT_AMBULATORY_CARE_PROVIDER_SITE_OTHER): Payer: Medicare Other | Admitting: Ophthalmology

## 2022-10-30 DIAGNOSIS — D3131 Benign neoplasm of right choroid: Secondary | ICD-10-CM | POA: Diagnosis not present

## 2022-10-30 DIAGNOSIS — H4423 Degenerative myopia, bilateral: Secondary | ICD-10-CM | POA: Diagnosis not present

## 2022-10-30 DIAGNOSIS — H59029 Cataract (lens) fragments in eye following cataract surgery, unspecified eye: Secondary | ICD-10-CM

## 2022-10-30 DIAGNOSIS — H43813 Vitreous degeneration, bilateral: Secondary | ICD-10-CM

## 2022-11-13 ENCOUNTER — Ambulatory Visit (INDEPENDENT_AMBULATORY_CARE_PROVIDER_SITE_OTHER): Payer: Medicare Other | Admitting: Family Medicine

## 2022-11-13 ENCOUNTER — Encounter: Payer: Self-pay | Admitting: Family Medicine

## 2022-11-13 VITALS — BP 126/74 | HR 56 | Temp 96.9°F | Ht 70.0 in | Wt 157.4 lb

## 2022-11-13 DIAGNOSIS — B009 Herpesviral infection, unspecified: Secondary | ICD-10-CM

## 2022-11-13 MED ORDER — VALACYCLOVIR HCL 500 MG PO TABS
ORAL_TABLET | ORAL | 0 refills | Status: DC
Start: 2022-11-13 — End: 2023-07-11

## 2022-11-13 NOTE — Progress Notes (Signed)
Chief Complaint  Patient presents with   Consult    Herpes flare that started Saturday. Usually takes acyclovir and does not have any. Location is at the top of his "butt crack". ( I asked him to put on a gown, he said he would prefer to just show you).    First noted this lesion on Saturday (2 days ago). He took a Clinical research associate yesterday--looked a little different than today, as there was an area of ulceration noted in the photo that has already healed.  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  Only had one other outbreak, and was in 07/2021.  He was prescribed Valtrex 1000mg , but didn't have any refills or pills left.   He retired from [employer].  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     cetirizine (ZYRTEC) 10 MG tablet Take 10 mg by mouth daily.    COMBIGAN 0.2-0.5 % ophthalmic solution Place 1 drop into the left eye 2 (two) times daily.    Difluprednate 0.05 % EMUL Apply to eye. 11/13/2022: Left eye   finasteride (PROSCAR) 5 MG tablet Take 1 tablet (5 mg total) by mouth daily.    fish oil-omega-3 fatty acids 1000 MG capsule Take 2 g by mouth daily.    fluticasone (FLONASE) 50 MCG/ACT nasal spray Place into the nose.    losartan-hydrochlorothiazide (HYZAAR) 100-12.5 MG tablet Take 1 tablet by mouth daily.    Multiple Vitamins-Minerals (MULTIVITAMIN WITH MINERALS) tablet Take 1 tablet by mouth daily.     PROLENSA 0.07 % SOLN Apply 1 drop to eye 2 (two) times daily. 11/13/2022: Left eye   tamsulosin (FLOMAX) 0.4 MG CAPS capsule Take 1 capsule (0.4 mg total) by mouth daily.    valACYclovir (VALTREX) 500 MG tablet Take 1 tablet by mouth twice daily for 3 days, as needed for herpes flares (6 pills/episode)    [DISCONTINUED] doxycycline (VIBRA-TABS) 100 MG tablet Take 1 tablet (100 mg  total) by mouth 2 (two) times daily.    [DISCONTINUED] mupirocin ointment (BACTROBAN) 2 % SMARTSIG:1 Application Topical 2-3 Times Daily    No facility-administered encounter medications on file as of 11/13/2022.   NOT taking valtrex prior to today's visit  ROS: no fever, chills no other rashes, no bleeding, bruising. He otherwise feels well.  PHYSICAL EXAM:  BP 126/74   Pulse (!) 56   Temp (!) 96.9 F (36.1 C) (Tympanic)   Ht 5\' 10"  (1.778 m)   Wt 157 lb 6.4 oz (71.4 kg)   BMI 22.58 kg/m   Well-appearing, pleasant male in no distress Upper right gluteal cleft-- A few raised purplish-appearing papules on an erythematous base  (No true vesicle or scab; no ulceration noted, as was noted in the photo)  ASSESSMENT/PLAN:  HSV infection - Plan: valACYclovir (VALTREX) 500 MG tablet  Valtrex 500 mg BID x 3d prn outbreaks.

## 2022-11-20 ENCOUNTER — Other Ambulatory Visit: Payer: Self-pay | Admitting: Family Medicine

## 2022-11-20 DIAGNOSIS — B009 Herpesviral infection, unspecified: Secondary | ICD-10-CM

## 2022-11-27 ENCOUNTER — Encounter (INDEPENDENT_AMBULATORY_CARE_PROVIDER_SITE_OTHER): Payer: Medicare Other | Admitting: Ophthalmology

## 2022-11-27 DIAGNOSIS — H4423 Degenerative myopia, bilateral: Secondary | ICD-10-CM | POA: Diagnosis not present

## 2022-11-27 DIAGNOSIS — D3131 Benign neoplasm of right choroid: Secondary | ICD-10-CM

## 2022-11-27 DIAGNOSIS — H59029 Cataract (lens) fragments in eye following cataract surgery, unspecified eye: Secondary | ICD-10-CM

## 2022-11-27 DIAGNOSIS — H43813 Vitreous degeneration, bilateral: Secondary | ICD-10-CM | POA: Diagnosis not present

## 2022-12-06 ENCOUNTER — Telehealth: Payer: Self-pay | Admitting: Family Medicine

## 2022-12-06 NOTE — Telephone Encounter (Signed)
Contacted [facility] to schedule their annual wellness visit. Welcome to Medicare visit Due by 07/15/23.  Rudell Cobb AWV direct phone # [phone]   WTM before 07/15/23 per [facility]

## 2023-01-22 ENCOUNTER — Encounter (INDEPENDENT_AMBULATORY_CARE_PROVIDER_SITE_OTHER): Payer: Medicare Other | Admitting: Ophthalmology

## 2023-01-25 ENCOUNTER — Encounter (INDEPENDENT_AMBULATORY_CARE_PROVIDER_SITE_OTHER): Payer: Medicare Other | Admitting: Ophthalmology

## 2023-01-25 DIAGNOSIS — H43813 Vitreous degeneration, bilateral: Secondary | ICD-10-CM

## 2023-01-25 DIAGNOSIS — H4423 Degenerative myopia, bilateral: Secondary | ICD-10-CM | POA: Diagnosis not present

## 2023-01-25 DIAGNOSIS — H59029 Cataract (lens) fragments in eye following cataract surgery, unspecified eye: Secondary | ICD-10-CM | POA: Diagnosis not present

## 2023-01-25 DIAGNOSIS — D3131 Benign neoplasm of right choroid: Secondary | ICD-10-CM

## 2023-04-12 ENCOUNTER — Encounter: Payer: Self-pay | Admitting: Family Medicine

## 2023-04-12 ENCOUNTER — Ambulatory Visit: Payer: Medicare Other | Admitting: Family Medicine

## 2023-04-12 VITALS — BP 140/80 | HR 54 | Ht 68.5 in | Wt 159.8 lb

## 2023-04-12 DIAGNOSIS — I10 Essential (primary) hypertension: Secondary | ICD-10-CM

## 2023-04-12 DIAGNOSIS — Z13220 Encounter for screening for lipoid disorders: Secondary | ICD-10-CM

## 2023-04-12 DIAGNOSIS — G4733 Obstructive sleep apnea (adult) (pediatric): Secondary | ICD-10-CM | POA: Diagnosis not present

## 2023-04-12 DIAGNOSIS — R3912 Poor urinary stream: Secondary | ICD-10-CM

## 2023-04-12 DIAGNOSIS — Z23 Encounter for immunization: Secondary | ICD-10-CM

## 2023-04-12 DIAGNOSIS — N401 Enlarged prostate with lower urinary tract symptoms: Secondary | ICD-10-CM

## 2023-04-12 DIAGNOSIS — Z9849 Cataract extraction status, unspecified eye: Secondary | ICD-10-CM

## 2023-04-12 DIAGNOSIS — Z96652 Presence of left artificial knee joint: Secondary | ICD-10-CM | POA: Diagnosis not present

## 2023-04-12 DIAGNOSIS — D126 Benign neoplasm of colon, unspecified: Secondary | ICD-10-CM

## 2023-04-12 DIAGNOSIS — J309 Allergic rhinitis, unspecified: Secondary | ICD-10-CM

## 2023-04-12 NOTE — Progress Notes (Signed)
Annual Wellness Visit     Patient: Jeffrey Forbes, Male    DOB: [birth year], 72 y.o.   MRN: [id number]  Subjective  Chief Complaint  Patient presents with   Medicare Wellness    Fasting Welcome to Medicare visit. No new concerns. Will take high dose flu shot today.     Jeffrey Forbes is a 72 y.o. male who presents today for his Annual Wellness Visit and CPE. He reports consuming a general diet.  No exercise, he states he wokrs.  He generally feels well. He reports sleeping well. He does not have additional problems to discuss today.  He is scheduled for follow-up colonoscopy in October.  He continues on CPAP and is doing well on that.  He has had recent bilateral cataract surgery.  He continues on finasteride and Flomax for his BPH symptoms.  He is also taking losartan/HCTZ and doing well on that.  His allergies are under good control.  He has had a left knee replacement and seems to be doing well with that.   Vision:Within last year      Medications: Outpatient Medications Prior to Visit  Medication Sig Note   aspirin 81 MG tablet Take 160 mg by mouth daily.     cetirizine (ZYRTEC) 10 MG tablet Take 10 mg by mouth daily.    COMBIGAN 0.2-0.5 % ophthalmic solution Place 1 drop into the left eye 2 (two) times daily.    Difluprednate 0.05 % EMUL Apply to eye.    finasteride (PROSCAR) 5 MG tablet Take 1 tablet (5 mg total) by mouth daily.    fish oil-omega-3 fatty acids 1000 MG capsule Take 2 g by mouth daily.    fluticasone (FLONASE) 50 MCG/ACT nasal spray Place into the nose.    losartan-hydrochlorothiazide (HYZAAR) 100-12.5 MG tablet Take 1 tablet by mouth daily.    Multiple Vitamins-Minerals (MULTIVITAMIN WITH MINERALS) tablet Take 1 tablet by mouth daily.     PROLENSA 0.07 % SOLN Apply 1 drop to eye 2 (two) times daily.    tamsulosin (FLOMAX) 0.4 MG CAPS capsule Take 1 capsule (0.4 mg total) by mouth daily.    valACYclovir (VALTREX) 500 MG tablet Take 1 tablet by mouth twice daily  for 3 days, as needed for herpes flares (6 pills/episode) (Patient not taking: Reported on 04/12/2023) 04/12/2023: As needed   No facility-administered medications prior to visit.    Allergies  Allergen Reactions   Atorvastatin Diarrhea    Patient Care Team: Ronnald Nian, MD as PCP - General (Family Medicine) GI[facility] Eye Surgeon: Dr. Delaney Meigs Retinal Specialist: Dr. Ashley Royalty Dentist: [facility] Urologist: Dr. Margo Aye Need ADV directive  Review of Systems  All other systems reviewed and are negative.       Objective  BP (!) 140/80   Pulse (!) 54   Ht 5' 8.5" (1.74 m)   Wt 159 lb 12.8 oz (72.5 kg)   BMI 23.94 kg/m    Physical Exam Alert and in no distress. Tympanic membranes and canals are normal. Pharyngeal area is normal. Neck is supple without adenopathy or thyromegaly. Cardiac exam shows a regular sinus rhythm without murmurs or gallops. Lungs are clear to auscultation.  Abdominal exam shows normal bowel sounds without masses or tenderness.    Most recent functional status assessment:    04/12/2023   10:13 AM  In your present state of health, do you have any difficulty performing the following activities:  Hearing? 0  Vision? 0  Difficulty concentrating or making decisions? 0  Walking or climbing stairs? 0  Dressing or bathing? 0  Doing errands, shopping? 0  Preparing Food and eating ? N  Using the Toilet? N  In the past six months, have you accidently leaked urine? Y  Do you have problems with loss of bowel control? N  Managing your Medications? N  Managing your Finances? N  Housekeeping or managing your Housekeeping? N   Most recent fall risk assessment:    04/12/2023   10:13 AM  Fall Risk   Falls in the past year? 0  Number falls in past yr: 0  Injury with Fall? 0  Risk for fall due to : No Fall Risks  Follow up Falls evaluation completed    Most recent depression screenings:    04/12/2023    10:13 AM 04/10/2022   11:31 AM  PHQ 2/9 Scores  PHQ - 2 Score 0 0   Most recent cognitive screening:     No data to display         Most recent Audit-C alcohol use screening     No data to display         A score of 3 or more in women, and 4 or more in men indicates increased risk for alcohol abuse, EXCEPT if all of the points are from question 1   Vision/Hearing Screen: No results found.     Assessment & Plan  Essential hypertension - Plan: CBC with Differential/Platelet, Comprehensive metabolic panel  S/P TKR (total knee replacement), left  Obstructive sleep apnea  Benign prostatic hyperplasia with weak urinary stream  Allergic rhinitis, mild  Screening for lipid disorders - Plan: Lipid panel  Need for influenza vaccination - Plan: Flu Vaccine Trivalent High Dose (Fluad)  History of cataract extraction, unspecified laterality  Adenomatous polyp of colon, unspecified part of colon  Annual wellness visit done today including the all of the following: Reviewed patient's Family Medical History Reviewed and updated list of patient's medical providers Assessment of cognitive impairment was done Assessed patient's functional ability Established a written schedule for health screening services Health Risk Assessent Completed and Reviewed  Exercise Activities and Dietary recommendations  Goals   None     Immunization History  Administered Date(s) Administered   COVID-19, mRNA, vaccine(Comirnaty)12 years and older 07/20/2022   DT (Pediatric) 08/15/1996   DTP 08/15/1996   DTaP 07/26/2006   Fluad Quad(high Dose 65+) 04/22/2021, 05/15/2022   Fluad Trivalent(High Dose 65+) 04/12/2023   Influenza Whole 05/07/2013   Influenza, High Dose Seasonal PF 05/28/2017, 06/26/2018, 06/30/2019, 05/21/2020, 04/22/2021   Influenza,inj,Quad PF,6+ Mos 09/17/2014   Influenza-Unspecified 09/17/2014, 05/28/2017, 06/26/2018, 06/30/2019   PFIZER Comirnaty(Gray Top)Covid-19  Tri-Sucrose Vaccine 05/21/2020   PFIZER(Purple Top)SARS-COV-2 Vaccination 10/27/2019, 11/17/2019, 05/21/2020   Pneumococcal Conjugate-13 06/12/2017   Pneumococcal Polysaccharide-23 06/26/2018   Respiratory Syncytial Virus Vaccine,Recomb Aduvanted(Arexvy) 07/20/2022   Tdap 01/14/2014   Zoster Recombinant(Shingrix) 05/28/2017, 08/02/2017   Zoster, Live 06/24/2010    Health Maintenance  Topic Date Due   COVID-19 Vaccine (6 - 2023-24 season) 04/28/2023 (Originally 09/14/2022)   DTaP/Tdap/Td (5 - Td or Tdap) 01/15/2024   Medicare Annual Wellness (AWV)  04/11/2024   Colonoscopy  04/04/2028   Pneumonia Vaccine 18+ Years old  Completed   INFLUENZA VACCINE  Completed   Hepatitis C Screening  Completed   Zoster Vaccines- Shingrix  Completed   HPV VACCINES  Aged Out     Discussed health benefits of physical activity, and encouraged  him to engage in regular exercise appropriate for his age and condition.    Problem List Items Addressed This Visit     Adenomatous polyp of colon   Allergic rhinitis, mild   Benign prostatic hyperplasia with lower urinary tract symptoms   Essential hypertension - Primary   Relevant Orders   CBC with Differential/Platelet   Comprehensive metabolic panel   History of cataract extraction   Obstructive sleep apnea   S/P TKR (total knee replacement), left   Other Visit Diagnoses     Screening for lipid disorders       Relevant Orders   Lipid panel   Need for influenza vaccination       Relevant Orders   Flu Vaccine Trivalent High Dose (Fluad) (Completed)     Continue on present medication regimen.  Follow-up 1 year   Sharlot Gowda, MD

## 2023-04-13 LAB — CBC WITH DIFFERENTIAL/PLATELET
Basophils Absolute: 0.1 10*3/uL (ref 0.0–0.2)
Basos: 1 %
EOS (ABSOLUTE): 0.1 10*3/uL (ref 0.0–0.4)
Eos: 3 %
Hematocrit: 38.4 % (ref 37.5–51.0)
Hemoglobin: 12.9 g/dL — ABNORMAL LOW (ref 13.0–17.7)
Immature Grans (Abs): 0 10*3/uL (ref 0.0–0.1)
Immature Granulocytes: 1 %
Lymphocytes Absolute: 1.4 10*3/uL (ref 0.7–3.1)
Lymphs: 30 %
MCH: 31.9 pg (ref 26.6–33.0)
MCHC: 33.6 g/dL (ref 31.5–35.7)
MCV: 95 fL (ref 79–97)
Monocytes Absolute: 0.3 10*3/uL (ref 0.1–0.9)
Monocytes: 7 %
Neutrophils Absolute: 2.8 10*3/uL (ref 1.4–7.0)
Neutrophils: 58 %
Platelets: 169 10*3/uL (ref 150–450)
RBC: 4.04 x10E6/uL — ABNORMAL LOW (ref 4.14–5.80)
RDW: 11.9 % (ref 11.6–15.4)
WBC: 4.7 10*3/uL (ref 3.4–10.8)

## 2023-04-13 LAB — LIPID PANEL
Chol/HDL Ratio: 3.3 ratio (ref 0.0–5.0)
Cholesterol, Total: 150 mg/dL (ref 100–199)
HDL: 46 mg/dL (ref >39)
LDL Chol Calc (NIH): 90 mg/dL (ref 0–99)
Triglycerides: 74 mg/dL (ref 0–149)
VLDL Cholesterol Cal: 14 mg/dL (ref 5–40)

## 2023-04-13 LAB — COMPREHENSIVE METABOLIC PANEL
ALT: 16 IU/L (ref 0–44)
AST: 18 IU/L (ref 0–40)
Albumin: 4.5 g/dL (ref 3.9–4.9)
Alkaline Phosphatase: 82 IU/L (ref 44–121)
BUN/Creatinine Ratio: 12 (ref 10–24)
BUN: 12 mg/dL (ref 8–27)
Bilirubin Total: 1.3 mg/dL — ABNORMAL HIGH (ref 0.0–1.2)
CO2: 29 mmol/L (ref 20–29)
Calcium: 9.1 mg/dL (ref 8.6–10.2)
Chloride: 102 mmol/L (ref 96–106)
Creatinine, Ser: 0.99 mg/dL (ref 0.76–1.27)
Globulin, Total: 2.1 g/dL (ref 1.5–4.5)
Glucose: 96 mg/dL (ref 70–99)
Potassium: 3.8 mmol/L (ref 3.5–5.2)
Sodium: 146 mmol/L — ABNORMAL HIGH (ref 134–144)
Total Protein: 6.6 g/dL (ref 6.0–8.5)
eGFR: 82 mL/min/{1.73_m2} (ref >59)

## 2023-04-26 ENCOUNTER — Encounter (INDEPENDENT_AMBULATORY_CARE_PROVIDER_SITE_OTHER): Payer: Medicare Other | Admitting: Ophthalmology

## 2023-04-26 DIAGNOSIS — H4423 Degenerative myopia, bilateral: Secondary | ICD-10-CM | POA: Diagnosis not present

## 2023-04-26 DIAGNOSIS — D3131 Benign neoplasm of right choroid: Secondary | ICD-10-CM | POA: Diagnosis not present

## 2023-04-26 DIAGNOSIS — H43813 Vitreous degeneration, bilateral: Secondary | ICD-10-CM | POA: Diagnosis not present

## 2023-05-17 LAB — HM COLONOSCOPY

## 2023-06-16 ENCOUNTER — Other Ambulatory Visit: Payer: Self-pay | Admitting: Family Medicine

## 2023-06-16 DIAGNOSIS — N401 Enlarged prostate with lower urinary tract symptoms: Secondary | ICD-10-CM

## 2023-06-17 ENCOUNTER — Other Ambulatory Visit: Payer: Self-pay | Admitting: Family Medicine

## 2023-06-17 DIAGNOSIS — N401 Enlarged prostate with lower urinary tract symptoms: Secondary | ICD-10-CM

## 2023-06-17 DIAGNOSIS — I10 Essential (primary) hypertension: Secondary | ICD-10-CM

## 2023-07-11 ENCOUNTER — Other Ambulatory Visit: Payer: Self-pay | Admitting: Family Medicine

## 2023-07-11 DIAGNOSIS — B009 Herpesviral infection, unspecified: Secondary | ICD-10-CM

## 2023-07-11 NOTE — Telephone Encounter (Signed)
Is this okay to refill?

## 2023-08-05 ENCOUNTER — Other Ambulatory Visit: Payer: Self-pay | Admitting: Family Medicine

## 2023-08-05 DIAGNOSIS — B009 Herpesviral infection, unspecified: Secondary | ICD-10-CM

## 2023-10-05 ENCOUNTER — Ambulatory Visit: Payer: Medicare Other | Admitting: Medical

## 2023-10-05 ENCOUNTER — Ambulatory Visit: Payer: Self-pay | Admitting: Family Medicine

## 2023-10-05 VITALS — BP 130/82 | HR 60 | Temp 97.6°F | Wt 175.0 lb

## 2023-10-05 DIAGNOSIS — N401 Enlarged prostate with lower urinary tract symptoms: Secondary | ICD-10-CM

## 2023-10-05 DIAGNOSIS — R32 Unspecified urinary incontinence: Secondary | ICD-10-CM

## 2023-10-05 LAB — POCT URINALYSIS DIP (PROADVANTAGE DEVICE)
Bilirubin, UA: NEGATIVE
Blood, UA: NEGATIVE
Glucose, UA: NEGATIVE mg/dL
Ketones, POC UA: NEGATIVE mg/dL
Leukocytes, UA: NEGATIVE
Nitrite, UA: NEGATIVE
Protein Ur, POC: NEGATIVE mg/dL
Specific Gravity, Urine: 1.01
Urobilinogen, Ur: NEGATIVE
pH, UA: 7.5 (ref 5.0–8.0)

## 2023-10-05 MED ORDER — SULFAMETHOXAZOLE-TRIMETHOPRIM 800-160 MG PO TABS: 1.0000 | ORAL_TABLET | Freq: Two times a day (BID) | ORAL | 0 refills | Status: DC

## 2023-10-05 NOTE — Progress Notes (Signed)
Subjective:  Jeffrey Forbes is a 73 y.o. male who presents for Chief Complaint  Patient presents with   urine leakage    Urine leakage, peed all over himself yesterday and today.  8-12 oz of urine each time, has been 3-4 times today     Here for urinary symptoms.    He notes over months that when climbing up ladders, or crawling in crawl spaces, may have to go urgently to urinate.  Frequency has increased.  However, yesterday had urinary incontinence full flow at a customers house . Today  No dark urine. Has some musty urine smell.   No burning with urination, no blood in urine, no cloudy urine.  Not married, no sexual partners.   No concern for STD.  Saw Dr. Margo Aye urology in [facility], [place] prior years ago  He denies back pain, leg pain, leg numbness tingling or weakness.  No fecal incontinence.  No fever body aches or chills.  No nausea or vomiting  No other aggravating or relieving factors.    No other c/o.  Past Medical History:  Diagnosis Date   Allergic rhinitis    Hemorrhoids    Hypertension    Personal history of colonic polyps    Sleep apnea    Current Outpatient Medications on File Prior to Visit  Medication Sig Dispense Refill   aspirin 81 MG tablet Take 160 mg by mouth daily.      cetirizine (ZYRTEC) 10 MG tablet Take 10 mg by mouth daily.     COMBIGAN 0.2-0.5 % ophthalmic solution Place 1 drop into the left eye 2 (two) times daily.     finasteride (PROSCAR) 5 MG tablet TAKE 1 TABLET (5 MG TOTAL) BY MOUTH DAILY. 90 tablet 2   fish oil-omega-3 fatty acids 1000 MG capsule Take 2 g by mouth daily.     fluticasone (FLONASE) 50 MCG/ACT nasal spray Place into the nose.     losartan-hydrochlorothiazide (HYZAAR) 100-12.5 MG tablet TAKE 1 TABLET BY MOUTH EVERY DAY 90 tablet 2   Multiple Vitamins-Minerals (MULTIVITAMIN WITH MINERALS) tablet Take 1 tablet by mouth daily.      tamsulosin (FLOMAX) 0.4 MG CAPS capsule TAKE 1 CAPSULE BY MOUTH EVERY DAY 90 capsule 3   valACYclovir  (VALTREX) 500 MG tablet TAKE 1 TABLET BY MOUTH TWICE DAILY FOR 3 DAYS, AS NEEDED FOR HERPES FLARES (6 PILLS/EPISODE) 180 tablet 1   No current facility-administered medications on file prior to visit.    The following portions of the patient's history were reviewed and updated as appropriate: allergies, current medications, past family history, past medical history, past social history, past surgical history and problem list.  ROS Otherwise as in subjective above    Objective: BP 130/82   Pulse 60   Temp 97.6 F (36.4 C)   Wt 175 lb (79.4 kg)   BMI 26.22 kg/m   Wt Readings from Last 3 Encounters:  10/05/23 175 lb (79.4 kg)  04/12/23 159 lb 12.8 oz (72.5 kg)  11/13/22 157 lb 6.4 oz (71.4 kg)    General appearance: alert, no distress, well developed, well nourished Abdomen: +bs, soft, non tender, non distended, no masses, no hepatomegaly, no splenomegaly Back nontender GU: normal male, no swelling or mass or lymphadenopathy but has mild tenderness with right scrotal exam, no hernia Pulses: 2+ radial pulses, 2+ pedal pulses, normal cap refill Ext: no edema   Assessment: Encounter Diagnoses  Name Primary?   Urinary incontinence, unspecified type Yes   Benign prostatic  hyperplasia with lower urinary tract symptoms, symptom details unspecified      Plan: We discussed symptoms, concerns, possible etiology.  Urinalysis is normal except for pH 7.5.    We discussed the possibility of orchitis versus prostatitis or urinary tract infection.  Urine culture sent.  Begin antibiotic as below.  Continue good water intake.  If not much improved within the next week then call back.  If symptoms persist despite antibiotic may need to get urology involved.  BPH-he continues on finasteride and Flomax in general, but the symptoms today are new change  Jeffrey "BILL" was seen today for urine leakage.  Diagnoses and all orders for this visit:  Urinary incontinence, unspecified type -      Urine Culture  Benign prostatic hyperplasia with lower urinary tract symptoms, symptom details unspecified -     Urine Culture  Other orders -     sulfamethoxazole-trimethoprim (BACTRIM DS) 800-160 MG tablet; Take 1 tablet by mouth 2 (two) times daily.   Follow up: pending urine culture

## 2023-10-05 NOTE — Telephone Encounter (Signed)
  Chief Complaint: urgency, frequency, leakage of urine Symptoms: see above- new onset Frequency: leakage started yesterday Pertinent Negatives: Patient denies blood in urine, fever, flank pain, pain with urination)  Disposition: [] ED /[] Urgent Care (no appt availability in office) / [x] Appointment(In office/virtual)/ []  [facility] [place] [facility]/ [] Home Care/ [] Refused Recommended Disposition /[] [place] [facility]/ []  Follow-up with PCP Additional Notes: Appointment scheduled today   Copied from CRM [id number]. Topic: Clinical - Red Word Triage >> Oct 05, 2023  9:51 AM Carlatta H wrote: [facility] that prompted transfer to Nurse Triage: Patient has been having frequent urination for about a month//This week if have been for frequent and it happens when he bends down or turn certain ways//Patient did urinate on hisself yesterday// Reason for Disposition  [1] Can't control passage of urine (i.e., urinary incontinence) AND [2] new-onset (< 2 weeks) or worsening  Answer Assessment - Initial Assessment Questions 1. SYMPTOM: "What's the main symptom you're concerned about?" (e.g., frequency, incontinence)     Frequency and leakage, urgency 2. ONSET: "When did the  symptoms  start?"     Several months- symptoms getting worse- especially with physical activity 3. PAIN: "Is there any pain?" If Yes, ask: "How bad is it?" (Scale: 1-10; mild, moderate, severe)     no 4. CAUSE: "What do you think is causing the symptoms?"     unsure 5. OTHER SYMPTOMS: "Do you have any other symptoms?" (e.g., blood in urine, fever, flank pain, pain with urination)     no  Protocols used: Urinary Symptoms-A-AH

## 2023-10-06 LAB — URINE CULTURE

## 2023-10-08 NOTE — Progress Notes (Signed)
 Results sent through MyChart

## 2023-10-09 ENCOUNTER — Encounter: Payer: Self-pay | Admitting: Internal Medicine

## 2024-01-02 ENCOUNTER — Ambulatory Visit (INDEPENDENT_AMBULATORY_CARE_PROVIDER_SITE_OTHER): Admitting: Medical

## 2024-01-02 VITALS — BP 140/80 | HR 71 | Temp 97.1°F | Wt 175.2 lb

## 2024-01-02 DIAGNOSIS — R2230 Localized swelling, mass and lump, unspecified upper limb: Secondary | ICD-10-CM | POA: Diagnosis not present

## 2024-01-02 DIAGNOSIS — N6321 Unspecified lump in the left breast, upper outer quadrant: Secondary | ICD-10-CM

## 2024-01-02 LAB — CBC WITH DIFFERENTIAL/PLATELET
Basophils Absolute: 0.1 10*3/uL (ref 0.0–0.2)
Basos: 1 %
EOS (ABSOLUTE): 0.2 10*3/uL (ref 0.0–0.4)
Eos: 3 %
Hematocrit: 42 % (ref 37.5–51.0)
Hemoglobin: 14.2 g/dL (ref 13.0–17.7)
Immature Grans (Abs): 0 10*3/uL (ref 0.0–0.1)
Immature Granulocytes: 1 %
Lymphocytes Absolute: 1.3 10*3/uL (ref 0.7–3.1)
Lymphs: 26 %
MCH: 31.9 pg (ref 26.6–33.0)
MCHC: 33.8 g/dL (ref 31.5–35.7)
MCV: 94 fL (ref 79–97)
Monocytes Absolute: 0.5 10*3/uL (ref 0.1–0.9)
Monocytes: 10 %
Neutrophils Absolute: 2.9 10*3/uL (ref 1.4–7.0)
Neutrophils: 59 %
Platelets: 169 10*3/uL (ref 150–450)
RBC: 4.45 x10E6/uL (ref 4.14–5.80)
RDW: 13.5 % (ref 11.6–15.4)
WBC: 4.9 10*3/uL (ref 3.4–10.8)

## 2024-01-02 NOTE — Progress Notes (Signed)
 Subjective:  Jeffrey Forbes is a 73 y.o. male who presents for Chief Complaint  Patient presents with   Acute Visit    Swelling of left breast, swelling of left underarm, had a sharp pain on left side and he felt something unlike he didn't feel on the other side     Here for swelling in left breast area/underarm.   Has had a fullness for the past year in the left axilla/ chest, but lately seems to have gotten a little bigger and has now had a twinge of pain in left axilla.   No pain with ROM or activity.  No fever.  Has lost weight intentionally in past year.  Was down to 159lb at one point in past year.   Doesn't feel the same on the right.  No other aggravating or relieving factors.    No other c/o.  Past Medical History:  Diagnosis Date   Allergic rhinitis    Hemorrhoids    Hypertension    Personal history of colonic polyps    Sleep apnea    Current Outpatient Medications on File Prior to Visit  Medication Sig Dispense Refill   aspirin  81 MG tablet Take 160 mg by mouth daily.      cetirizine  (ZYRTEC ) 10 MG tablet Take 10 mg by mouth daily.     COMBIGAN 0.2-0.5 % ophthalmic solution Place 1 drop into the left eye 2 (two) times daily.     finasteride  (PROSCAR ) 5 MG tablet TAKE 1 TABLET (5 MG TOTAL) BY MOUTH DAILY. 90 tablet 2   fish oil-omega-3 fatty acids 1000 MG capsule Take 2 g by mouth daily.     fluticasone  (FLONASE ) 50 MCG/ACT nasal spray Place into the nose.     losartan -hydrochlorothiazide  (HYZAAR) 100-12.5 MG tablet TAKE 1 TABLET BY MOUTH EVERY DAY 90 tablet 2   Multiple Vitamins-Minerals (MULTIVITAMIN WITH MINERALS) tablet Take 1 tablet by mouth daily.      tamsulosin  (FLOMAX ) 0.4 MG CAPS capsule TAKE 1 CAPSULE BY MOUTH EVERY DAY 90 capsule 3   valACYclovir  (VALTREX ) 500 MG tablet TAKE 1 TABLET BY MOUTH TWICE DAILY FOR 3 DAYS, AS NEEDED FOR HERPES FLARES (6 PILLS/EPISODE) (Patient not taking: Reported on 01/02/2024) 180 tablet 1   No current facility-administered medications  on file prior to visit.    The following portions of the patient's history were reviewed and updated as appropriate: allergies, current medications, past family history, past medical history, past social history, past surgical history and problem list.  ROS Otherwise as in subjective above     Objective: BP (!) 140/80   Pulse 71   Temp (!) 97.1 F (36.2 C)   Wt 175 lb 3.2 oz (79.5 kg)   BMI 26.25 kg/m   Wt Readings from Last 3 Encounters:  01/02/24 175 lb 3.2 oz (79.5 kg)  10/05/23 175 lb (79.4 kg)  04/12/23 159 lb 12.8 oz (72.5 kg)    General appearance: alert, no distress, well developed, well nourished Left axilla with a fullness along the superior portion anteriorly in the axilla along with the left chest wall, suggestive more of a lipoma.  The fullness is approximately 4 cm x 3 cm but no distinct mass or mobile lesion there is some tenderness in the mid part of the left axilla in general with no obvious lymphadenopathy.  No other breast mass or chest wall mass.  No supraclavicular lymph nodes palpated. Lungs clear Otherwise well-appearing    Assessment: Encounter Diagnoses  Name Primary?  Axillary fullness Yes   Mass of upper outer quadrant of left breast      Plan: We discussed possible causes including cyst, lipoma, mass or other.  Orders as below   Jeffrey "BILL" was seen today for acute visit.  Diagnoses and all orders for this visit:  Axillary fullness -     CBC with Differential/Platelet; Future -     US  BREAST COMPLETE UNI LEFT INC AXILLA -     MM 3D DIAGNOSTIC MAMMOGRAM UNILATERAL LEFT BREAST  Mass of upper outer quadrant of left breast -     CBC with Differential/Platelet; Future -     US  BREAST COMPLETE UNI LEFT INC AXILLA -     MM 3D DIAGNOSTIC MAMMOGRAM UNILATERAL LEFT BREAST    Follow up: pending labs and imaging

## 2024-01-03 ENCOUNTER — Ambulatory Visit: Payer: Self-pay | Admitting: Medical

## 2024-01-03 DIAGNOSIS — R32 Unspecified urinary incontinence: Secondary | ICD-10-CM

## 2024-01-03 DIAGNOSIS — N401 Enlarged prostate with lower urinary tract symptoms: Secondary | ICD-10-CM

## 2024-01-03 NOTE — Progress Notes (Signed)
 Results sent through MyChart

## 2024-01-10 ENCOUNTER — Ambulatory Visit (INDEPENDENT_AMBULATORY_CARE_PROVIDER_SITE_OTHER): Admitting: Family Medicine

## 2024-01-10 ENCOUNTER — Encounter: Payer: Self-pay | Admitting: Family Medicine

## 2024-01-10 VITALS — BP 108/70 | HR 60 | Wt 178.2 lb

## 2024-01-10 DIAGNOSIS — Z01818 Encounter for other preprocedural examination: Secondary | ICD-10-CM | POA: Diagnosis not present

## 2024-01-10 DIAGNOSIS — M1712 Unilateral primary osteoarthritis, left knee: Secondary | ICD-10-CM | POA: Diagnosis not present

## 2024-01-10 NOTE — Progress Notes (Signed)
   Subjective:    Patient ID: Jeffrey Forbes, male    DOB: May 16, 1951, 73 y.o.   MRN: [id number]  HPI He is here for preoperative evaluation.  He is getting ready to have knee replacement.  He has already had 1 done and so knows the situation and how to handle rehab.  Presently he is having no chest pain, shortness of breath, fever, chills.  Does not smoke.   Review of Systems     Objective:    Physical Exam  Alert and in no distress. Tympanic membranes and canals are normal. Pharyngeal area is normal. Neck is supple without adenopathy or thyromegaly. Cardiac exam shows a regular sinus rhythm without murmurs or gallops. Lungs are clear to auscultation.       Assessment & Plan:  Arthritis of knee, left  Pre-op evaluation Okay for surgery

## 2024-01-17 ENCOUNTER — Ambulatory Visit
Admission: RE | Admit: 2024-01-17 | Discharge: 2024-01-17 | Disposition: A | Discharge status: Home / Self Care | Payer: Self-pay | Source: Ambulatory Visit | Attending: Medical | Admitting: Medical

## 2024-01-18 NOTE — Progress Notes (Signed)
 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  Tumors are typically benign.  They can get bigger.  They also can be cut out if it is causing problems.  If he wants to pursue something like this we can refer to general surgery.  Thankfully no worrisome mass suggestive of cancer

## 2024-02-19 NOTE — Progress Notes (Signed)
    Chief Complaint: No chief complaint on file.   History of Present Illness:  Jeffrey Forbes is a 73 y.o. male who is seen in consultation from Jeffrey Norleen BROCKS, MD for evaluation of ***.   Past Medical History:  Past Medical History:  Diagnosis Date   Allergic rhinitis    Hemorrhoids    Hypertension    Personal history of colonic polyps    Sleep apnea     Past Surgical History:  Past Surgical History:  Procedure Laterality Date   COLONOSCOPY  2008   Dr. Rollin   HERNIA REPAIR     LASIK      Allergies:  Allergies  Allergen Reactions   Atorvastatin  Diarrhea    Family History:  Family History  Adopted: Yes    Social History:  Social History   Tobacco Use   Smoking status: Never   Smokeless tobacco: Never  Substance Use Topics   Alcohol use: Yes    Comment: occasional (1-2/month)   Drug use: No    Review of symptoms:  Constitutional:  Negative for unexplained weight loss, night sweats, fever, chills ENT:  Negative for nose bleeds, sinus pain, painful swallowing CV:  Negative for chest pain, shortness of breath, exercise intolerance, palpitations, loss of consciousness Resp:  Negative for cough, wheezing, shortness of breath GI:  Negative for nausea, vomiting, diarrhea, bloody stools GU:  Positives noted in HPI; otherwise negative for gross hematuria, dysuria, urinary incontinence Neuro:  Negative for seizures, poor balance, limb weakness, slurred speech Psych:  Negative for lack of energy, depression, anxiety Endocrine:  Negative for polydipsia, polyuria, symptoms of hypoglycemia (dizziness, hunger, sweating) Hematologic:  Negative for anemia, purpura, petechia, prolonged or excessive bleeding, use of anticoagulants  Allergic:  Negative for difficulty breathing or choking as a result of exposure to anything; no shellfish allergy; no allergic response (rash/itch) to materials, foods  Physical exam: There were no vitals taken for this visit. GENERAL  APPEARANCE:  Well appearing, well developed, well nourished, NAD HEENT: Atraumatic, Normocephalic. NECK: Normal appearance LUNGS: Normal inspiratory and expiratory excursion HEART: Regular Rate ABDOMEN: ***. GU: Phallus normal, no lesions. Scrotal skin normal. Testicles/epididymal structures normal. Meatus normal. Normal anal sphincter tone, prostate ***mL, symmetric, non nodular, non tender. EXTREMITIES: Moves all extremities well.  Without clubbing, cyanosis, or edema. NEUROLOGIC:  Alert and oriented x 3, normal gait, CN II-XII grossly intact.  MENTAL STATUS:  Appropriate. SKIN:  Warm, dry and intact.    Results: No results found for this or any previous visit (from the past 24 hours).  I have reviewed referring/prior physicians notes  I have reviewed urinalysis  I have reviewed PSA results  I have reviewed prior imaging--CT reading from 2019 reviewed.  At that time patient was found to have a normal-sized prostate.  I have reviewed urine culture results  Assessment: ***   Plan: ***

## 2024-02-20 ENCOUNTER — Encounter: Payer: Self-pay | Admitting: Urology

## 2024-02-20 ENCOUNTER — Ambulatory Visit (INDEPENDENT_AMBULATORY_CARE_PROVIDER_SITE_OTHER): Admitting: Urology

## 2024-02-20 VITALS — BP 144/87 | HR 61 | Ht 70.0 in | Wt 168.0 lb

## 2024-02-20 DIAGNOSIS — R3915 Urgency of urination: Secondary | ICD-10-CM

## 2024-02-20 DIAGNOSIS — N3281 Overactive bladder: Secondary | ICD-10-CM

## 2024-02-20 DIAGNOSIS — R32 Unspecified urinary incontinence: Secondary | ICD-10-CM

## 2024-02-20 DIAGNOSIS — R350 Frequency of micturition: Secondary | ICD-10-CM

## 2024-02-20 DIAGNOSIS — Z125 Encounter for screening for malignant neoplasm of prostate: Secondary | ICD-10-CM

## 2024-02-20 LAB — URINALYSIS, ROUTINE W REFLEX MICROSCOPIC
Bilirubin, UA: NEGATIVE
Glucose, UA: NEGATIVE
Ketones, UA: NEGATIVE
Leukocytes,UA: NEGATIVE
Nitrite, UA: NEGATIVE
Protein,UA: NEGATIVE
Specific Gravity, UA: 1.01 (ref 1.005–1.030)
Urobilinogen, Ur: 0.2 mg/dL (ref 0.2–1.0)
pH, UA: 7 (ref 5.0–7.5)

## 2024-02-20 LAB — BLADDER SCAN AMB NON-IMAGING: Scan Result: 23

## 2024-02-20 LAB — MICROSCOPIC EXAMINATION
Bacteria, UA: NONE SEEN
WBC, UA: NONE SEEN /HPF (ref 0–5)

## 2024-02-20 NOTE — Addendum Note (Signed)
 Addended by: KOLEEN CREE C on: 02/20/2024 02:54 PM   Modules accepted: Orders

## 2024-02-21 LAB — PSA: Prostate Specific Ag, Serum: 3.7 ng/mL (ref 0.0–4.0)

## 2024-03-16 ENCOUNTER — Other Ambulatory Visit: Payer: Self-pay | Admitting: Family Medicine

## 2024-03-16 DIAGNOSIS — I10 Essential (primary) hypertension: Secondary | ICD-10-CM

## 2024-03-16 DIAGNOSIS — N401 Enlarged prostate with lower urinary tract symptoms: Secondary | ICD-10-CM

## 2024-05-13 NOTE — Unmapped External Note (Signed)
 Physical Therapy Discharge Summary  Payor: [employer] MEDICARE COMPLETE CHOICE [place] / Plan: [employer]HC MEDICARE COMPLETE CHOICE MA / Product Type: Medicare Advantage /    Referring Diagnosis: M17.11 - Osteoarthritis of right knee, unspecified osteoarthritis type Z47.1, Z96.651 - Aftercare following right knee joint replacement surgery   Referring Clinician:  Verta Emeline Sieving, P* Therapy Diagnosis:   1. Decreased ROM of right knee      2. 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 States that he is able to go up and down ladders now and stairs are better. States that he is ready to continue independently with his home exercise program.  Current Occupation:   working as Art therapist at [employer] Pain:   0/10   OBJECTIVE  General Observation:  Ambulates into Test and Measures:  See goals    Outcome Measure: Not performed today  Interventions: Therapeutic Exercise: Discharge performed today, see note and goals. Began today on recumbent bike x4 mins for warm up and during subjective intake intake. Continued with exercises to improve knee strength and range of motion. Verbal and visual review of current home exercise program as well as additional exercises to progress with home exercise program independently.  1.) knee flexion rocks on stairs 2.) hamstring stretch in stairs 3.) quad sets in supine with overpressure x10 reps 4.) seated resisted knee extension with blue band, x20 reps RLE 5.) standing resisted knee flexion with blue band, x20 reps RLE only 6.) standing resisted hip abduction with blue band, x~20 reps each LE 7.) tandem stance and SLS (visual review only)   Education:   Yes, as described in interventions   ASSESSMENT Today is Mr. Saephan 13th tx. He has met 5/5 of his goals for physical therapy. Discharge performed today, 05/13/24. He will continue independently with his home exercise program.   Therapy Program to Date: In summary, the therapy program has included: Gait Training, Neuromuscular Re-Ed, Therapeutic Exercises, Therapeutic Activities, and Manual Therapy  Progress Towards Goals:     Goals Addressed             This Visit's Progress   . PT Goal       In 3-4weeks, pt will be independent with HEP and compliant 100% of the time in order to return to PLOF (prior level of function). GOAL MET In 10weeks, Pt will improve KOOS jr outcome measure score from 54.84% to >75% in order to perform Activities of daily living (ADLs) and instrumental activities of daily living. 04/18/24: 79.91% GOAL MET In 5weeks, Pt will demonstrate increased AROM of right knee with extension 0-2 degrees and flexion >110 degrees in order to perform ADLs. 04/18/24: knee flexion: 107 degree, Knee extension: lacking 3 degrees 05/13/24: knee flexion active range of motion: 116 deg, knee extension active range of motion: lacking 2 degree GOAL MET In 10 weeks, Pt will demonstrate increased strength of RLE with all gross MMTs to 5/5 in order to perform stairs reciprocally. 04/18/24: hip flexion: left: 5/5, right: 4+/5, hip abduction/adduction: 5/5, Knee flexion/extension: 5/5, DF: 5/5  05/13/24: hip flexion: 5/5 bilaterally, knee extension/flexion: 5/5 bilaterally, hip adduction/abduction: 5/5 GOAL MET In 5weeks, Pt will be independent with gait in order  to return to prior level of function. GOAL MET 04/18/24          Progress Summary: see goals and assessment   PLAN  Recommendations:  Physical Therapy services are discontinued at this time secondary to:  Goals have been achieved., No need for skilled therapy at this time., and Patient is independent with home exercise  program.  Development of Plan of Care:  The discharge plan was discussed and agreed upon by the patient and/or family.  Total Treatment Time (Time & Untimed): Total Treatment minutes: 39 Total Time in Timed Codes: Timed Code Minutes: 39     PT Treatment/Procedure Therapeutic Exercises minutes: 39

## 2024-05-20 ENCOUNTER — Ambulatory Visit (INDEPENDENT_AMBULATORY_CARE_PROVIDER_SITE_OTHER): Payer: Medicare Other | Admitting: Family Medicine

## 2024-05-20 ENCOUNTER — Encounter: Payer: Self-pay | Admitting: Family Medicine

## 2024-05-20 VITALS — BP 124/82 | HR 60 | Ht 70.0 in | Wt 166.4 lb

## 2024-05-20 DIAGNOSIS — D649 Anemia, unspecified: Secondary | ICD-10-CM

## 2024-05-20 DIAGNOSIS — Z136 Encounter for screening for cardiovascular disorders: Secondary | ICD-10-CM | POA: Diagnosis not present

## 2024-05-20 DIAGNOSIS — N401 Enlarged prostate with lower urinary tract symptoms: Secondary | ICD-10-CM

## 2024-05-20 DIAGNOSIS — I10 Essential (primary) hypertension: Secondary | ICD-10-CM | POA: Diagnosis not present

## 2024-05-20 DIAGNOSIS — Z0000 Encounter for general adult medical examination without abnormal findings: Secondary | ICD-10-CM | POA: Diagnosis not present

## 2024-05-20 DIAGNOSIS — Z23 Encounter for immunization: Secondary | ICD-10-CM | POA: Diagnosis not present

## 2024-05-20 LAB — LIPID PANEL

## 2024-05-20 MED ORDER — LOSARTAN POTASSIUM-HCTZ 100-12.5 MG PO TABS
1.0000 | ORAL_TABLET | Freq: Every day | ORAL | 1 refills | Status: DC
Start: 2024-05-20 — End: 2024-06-13

## 2024-05-20 NOTE — Addendum Note (Signed)
 Addended by: LATTIE CARLO BROCKS on: 05/20/2024 11:38 AM   Modules accepted: Orders

## 2024-05-20 NOTE — Progress Notes (Signed)
 Name: Jeffrey Forbes   Date of Visit: 05/20/24   Date of last visit with me: Visit date not found   CHIEF COMPLAINT:  Chief Complaint  Patient presents with   Annual Exam    Cpe. Awv.        HPI:  Discussed the use of AI scribe software for clinical note transcription with the patient, who gave verbal consent to proceed.  History of Present Illness   Jeffrey Forbes is a 73 year old male who presents for medication refills and vaccination updates.  He has not received the flu shot or the updated COVID vaccine this year. He had COVID once in June, despite being fully vaccinated, with symptoms including feeling cold despite warm weather and spending two days bedridden.  He is currently seeing a urologist for leakage and incontinence issues and has been taken off tamsulosin . He is also on finasteride .  He uses Combigon eye drops once daily in his left eye, with current instructions to discontinue use once the bottle runs out. He takes a daily aspirin  at a dose of 81 mg.  He has a history of working as a Paramedic and has been vaccinated regularly, including the polio vaccine in the seventh grade.         OBJECTIVE:       05/20/2024   10:20 AM  Depression screen PHQ 2/9  Decreased Interest 0  Down, Depressed, Hopeless 0  PHQ - 2 Score 0     BP Readings from Last 3 Encounters:  05/20/24 124/82  02/20/24 (!) 144/87  01/10/24 108/70    BP 124/82   Pulse 60   Ht 5' 10 (1.778 m)   Wt 166 lb 6.4 oz (75.5 kg)   SpO2 98%   BMI 23.88 kg/m    Physical Exam          Physical Exam Constitutional:      Appearance: Normal appearance.  Neurological:     General: No focal deficit present.     Mental Status: He is alert and oriented to person, place, and time. Mental status is at baseline.     ASSESSMENT/PLAN:   Assessment & Plan Essential hypertension  Benign prostatic hyperplasia with lower urinary tract symptoms, symptom details unspecified  Anemia,  unspecified type  Medicare annual wellness visit, subsequent  Annual physical exam  Encounter for screening for cardiovascular disorders    Assessment and Plan    Chronic Benign Prostatic Hyperplasia with Lower Urinary Tract Symptoms Experiencing leakage and incontinence. Under urologist care, tamsulosin  discontinued. - Follow-up with urologist on Thursday. - Hold tamsulosin  for now, continue proscar .   Hypertension Managed with losartan , no acute issues. Chronic with no acute exacerbation - Refill losartan  prescription. -CMP ordered  Hx of anemia - Previous cbc with mild low Hgb, will repeat CBC today.   Medicare wellness. Discussed COVID vaccine benefits in preventing severe symptoms. - Administer flu vaccine. - Administer COVID vaccine. - Order basic blood work including cholesterol and electrolytes.  -As part of today's encounter, I reviewed the Medicare-specific components relevant to this patient's care. This included verification of eligibility for preventive services, assessment of screening needs based on age and risk factors, and review of applicable health maintenance items in alignment with current CMS guidelines. Preventive service recommendations and screenings were discussed with the patient, and appropriate care was given.   Annual Physical -- Full history and exam completed today in addition to [facility] Wellness Visit. Reviewed interval concerns, chronic conditions, and  preventive care needs. Physical exam performed. Counseling provided on lifestyle, screenings, vaccines, and routine health maintenance.         Makari Portman A. Vita MD [facility]

## 2024-05-21 ENCOUNTER — Ambulatory Visit: Admitting: Urology

## 2024-05-21 ENCOUNTER — Ambulatory Visit: Payer: Self-pay | Admitting: Family Medicine

## 2024-05-21 LAB — COMPREHENSIVE METABOLIC PANEL WITH GFR
ALT: 14 IU/L (ref 0–44)
AST: 14 IU/L (ref 0–40)
Albumin: 4.6 g/dL (ref 3.8–4.8)
Alkaline Phosphatase: 73 IU/L (ref 47–123)
BUN/Creatinine Ratio: 20 (ref 10–24)
BUN: 19 mg/dL (ref 8–27)
Bilirubin Total: 1.5 mg/dL — ABNORMAL HIGH (ref 0.0–1.2)
CO2: 25 mmol/L (ref 20–29)
Calcium: 9 mg/dL (ref 8.6–10.2)
Chloride: 100 mmol/L (ref 96–106)
Creatinine, Ser: 0.94 mg/dL (ref 0.76–1.27)
Globulin, Total: 1.9 g/dL (ref 1.5–4.5)
Glucose: 101 mg/dL — ABNORMAL HIGH (ref 70–99)
Potassium: 3.3 mmol/L — ABNORMAL LOW (ref 3.5–5.2)
Sodium: 139 mmol/L (ref 134–144)
Total Protein: 6.5 g/dL (ref 6.0–8.5)
eGFR: 86 mL/min/1.73 (ref >59)

## 2024-05-21 LAB — CBC WITH DIFFERENTIAL/PLATELET
Basophils Absolute: 0.1 x10E3/uL (ref 0.0–0.2)
Basos: 1 %
EOS (ABSOLUTE): 0.2 x10E3/uL (ref 0.0–0.4)
Eos: 3 %
Hematocrit: 41.5 % (ref 37.5–51.0)
Hemoglobin: 13.5 g/dL (ref 13.0–17.7)
Immature Grans (Abs): 0 x10E3/uL (ref 0.0–0.1)
Immature Granulocytes: 0 %
Lymphocytes Absolute: 1.6 x10E3/uL (ref 0.7–3.1)
Lymphs: 26 %
MCH: 30.1 pg (ref 26.6–33.0)
MCHC: 32.5 g/dL (ref 31.5–35.7)
MCV: 93 fL (ref 79–97)
Monocytes Absolute: 0.5 x10E3/uL (ref 0.1–0.9)
Monocytes: 7 %
Neutrophils Absolute: 4 x10E3/uL (ref 1.4–7.0)
Neutrophils: 63 %
Platelets: 198 x10E3/uL (ref 150–450)
RBC: 4.48 x10E6/uL (ref 4.14–5.80)
RDW: 14.2 % (ref 11.6–15.4)
WBC: 6.4 x10E3/uL (ref 3.4–10.8)

## 2024-05-21 LAB — LIPID PANEL
Cholesterol, Total: 146 mg/dL (ref 100–199)
HDL: 37 mg/dL — AB (ref >39)
LDL CALC COMMENT:: 3.9 ratio (ref 0.0–5.0)
LDL Chol Calc (NIH): 79 mg/dL (ref 0–99)
Triglycerides: 177 mg/dL — AB (ref 0–149)
VLDL Cholesterol Cal: 30 mg/dL (ref 5–40)

## 2024-06-08 NOTE — Progress Notes (Unsigned)
    Assessment: 1.  Prior history of BPH although he has a small gland on palpation.  Urinating better now on solo therapy with finasteride   2.  Urinary frequency and urgency, good stream, low residual.  Symptoms improved,.  3.  Screening for prostate cancer-elevated PSA when corrected for finasteride .  Apparently, this was diagnosed back in 2018 by Dr. Shona but the patient has not had an ultrasound and biopsy.  No old PSA data available  4.  ED, he would like to try medical therapy  5.  Microscopic hematuria on urinalysis today, no prior history of this   Plan: 1.  I will check iso-PSA.  2.  No immediate evaluation for his newfound microscopic hematuria  3.  Tadalafil sent in  4.  Continue monotherapy with finasteride   History of Present Illness:  7.25.2025: Initial visit for evaluation of urinary frequency and urgency.  This has been getting worse over the past 3 to 4 months.  He has been on finasteride  and tamsulosin  for quite a few years.  In the past, he saw Dr. Lorrene Shona at [facility] urology.  He has been on the medications ever since.  Currently he has a good stream.  He feels like he empties well.  He has had no gross hematuria, has had no recent urinary tract infection.  He states that in the past he has had cystoscopy for what he feels was to rule out cancer.  PSA (corrected value) 7.4  10.27.2025: Here today for recheck.  He is doing well with finasteride  alone, he stopped tamsulosin  after his last visit.  IPSS 3/1.  No gross hematuria.  He would like to consider medical therapy for his ED.  Past Medical History:  Past Medical History:  Diagnosis Date   Allergic rhinitis    Hemorrhoids    Hypertension    Personal history of colonic polyps    Sleep apnea     Past Surgical History:  Past Surgical History:  Procedure Laterality Date   COLONOSCOPY  2008   Dr. Rollin   HERNIA REPAIR     LASIK      Allergies:  Allergies  Allergen Reactions   Atorvastatin   Diarrhea   Statins Diarrhea    Family History:  Family History  Adopted: Yes    Social History:  Social History   Tobacco Use   Smoking status: Never   Smokeless tobacco: Never  Substance Use Topics   Alcohol use: Yes    Comment: occasional (1-2/month)   Drug use: No    Results:  I have reviewed referring/prior physicians notes--prior [facility] urology notes reviewed  I have reviewed urinalysis--micro scopic hematuria noted  Corrected PSA from July of this year 7.4 I have reviewed prior imaging--CT reading from 2019 reviewed.  At that time patient was found to have a normal-sized prostate.  IPSS sheet reviewed

## 2024-06-09 ENCOUNTER — Ambulatory Visit (INDEPENDENT_AMBULATORY_CARE_PROVIDER_SITE_OTHER): Admitting: Urology

## 2024-06-09 ENCOUNTER — Encounter: Payer: Self-pay | Admitting: Urology

## 2024-06-09 VITALS — BP 179/83 | HR 56 | Ht 70.0 in | Wt 164.0 lb

## 2024-06-09 DIAGNOSIS — Z125 Encounter for screening for malignant neoplasm of prostate: Secondary | ICD-10-CM

## 2024-06-09 DIAGNOSIS — R3129 Other microscopic hematuria: Secondary | ICD-10-CM | POA: Diagnosis not present

## 2024-06-09 DIAGNOSIS — R32 Unspecified urinary incontinence: Secondary | ICD-10-CM

## 2024-06-09 DIAGNOSIS — R3915 Urgency of urination: Secondary | ICD-10-CM | POA: Diagnosis not present

## 2024-06-09 DIAGNOSIS — N529 Male erectile dysfunction, unspecified: Secondary | ICD-10-CM | POA: Diagnosis not present

## 2024-06-09 DIAGNOSIS — R350 Frequency of micturition: Secondary | ICD-10-CM

## 2024-06-09 DIAGNOSIS — N3281 Overactive bladder: Secondary | ICD-10-CM

## 2024-06-09 DIAGNOSIS — N5201 Erectile dysfunction due to arterial insufficiency: Secondary | ICD-10-CM

## 2024-06-09 DIAGNOSIS — Z87438 Personal history of other diseases of male genital organs: Secondary | ICD-10-CM

## 2024-06-09 LAB — MICROSCOPIC EXAMINATION

## 2024-06-09 LAB — URINALYSIS, ROUTINE W REFLEX MICROSCOPIC
Bilirubin, UA: NEGATIVE
Glucose, UA: NEGATIVE
Ketones, UA: NEGATIVE
Leukocytes,UA: NEGATIVE
Nitrite, UA: NEGATIVE
Specific Gravity, UA: 1.015 (ref 1.005–1.030)
Urobilinogen, Ur: 0.2 mg/dL (ref 0.2–1.0)
pH, UA: 7 (ref 5.0–7.5)

## 2024-06-09 MED ORDER — TADALAFIL 20 MG PO TABS: ORAL_TABLET | ORAL | 11 refills | Status: AC

## 2024-06-10 ENCOUNTER — Other Ambulatory Visit: Payer: Self-pay | Admitting: Urology

## 2024-06-10 DIAGNOSIS — N401 Enlarged prostate with lower urinary tract symptoms: Secondary | ICD-10-CM

## 2024-06-10 MED ORDER — FINASTERIDE 5 MG PO TABS: 5.0000 mg | ORAL_TABLET | Freq: Every day | ORAL | 3 refills | Status: AC

## 2024-06-13 ENCOUNTER — Other Ambulatory Visit: Payer: Self-pay | Admitting: Family Medicine

## 2024-06-13 ENCOUNTER — Encounter: Payer: Self-pay | Admitting: Urology

## 2024-06-13 DIAGNOSIS — I10 Essential (primary) hypertension: Secondary | ICD-10-CM

## 2024-07-02 ENCOUNTER — Encounter: Payer: Self-pay | Admitting: Urology

## 2025-05-21 ENCOUNTER — Encounter: Payer: Self-pay | Admitting: Family Medicine
